# Patient Record
Sex: Male | Born: 1955 | Race: White | Hispanic: No | Marital: Married | Smoking: Former smoker
Health system: Southern US, Community
[De-identification: ages and names within clinical notes are randomized; demographics above are authoritative.]

## PROBLEM LIST (undated history)

## (undated) DIAGNOSIS — D751 Secondary polycythemia: Secondary | ICD-10-CM

## (undated) DIAGNOSIS — Z8572 Personal history of non-Hodgkin lymphomas: Principal | ICD-10-CM

## (undated) DIAGNOSIS — C801 Malignant (primary) neoplasm, unspecified: Secondary | ICD-10-CM

## (undated) DIAGNOSIS — R768 Other specified abnormal immunological findings in serum: Secondary | ICD-10-CM

## (undated) DIAGNOSIS — M545 Low back pain, unspecified: Secondary | ICD-10-CM

## (undated) DIAGNOSIS — C8218 Follicular lymphoma grade II, lymph nodes of multiple sites: Secondary | ICD-10-CM

## (undated) DIAGNOSIS — D229 Melanocytic nevi, unspecified: Secondary | ICD-10-CM

## (undated) DIAGNOSIS — M810 Age-related osteoporosis without current pathological fracture: Secondary | ICD-10-CM

## (undated) DIAGNOSIS — R109 Unspecified abdominal pain: Secondary | ICD-10-CM

## (undated) DIAGNOSIS — K458 Other specified abdominal hernia without obstruction or gangrene: Secondary | ICD-10-CM

## (undated) DIAGNOSIS — Z9221 Personal history of antineoplastic chemotherapy: Secondary | ICD-10-CM

## (undated) DIAGNOSIS — G8929 Other chronic pain: Secondary | ICD-10-CM

## (undated) DIAGNOSIS — Z23 Encounter for immunization: Secondary | ICD-10-CM

## (undated) HISTORY — PX: PORTACATH PLACEMENT: SHX2246

## (undated) HISTORY — DX: Low back pain: M54.5

## (undated) HISTORY — PX: WRIST SURGERY: SHX841

## (undated) HISTORY — DX: Personal history of non-Hodgkin lymphomas: Z85.72

## (undated) HISTORY — DX: Melanocytic nevi, unspecified: D22.9

## (undated) HISTORY — DX: Other specified abnormal immunological findings in serum: R76.8

## (undated) HISTORY — PX: DENTAL SURGERY: SHX609

## (undated) HISTORY — DX: Age-related osteoporosis without current pathological fracture: M81.0

## (undated) HISTORY — PX: LYMPH NODE BIOPSY: SHX201

## (undated) HISTORY — DX: Other specified abdominal hernia without obstruction or gangrene: K45.8

## (undated) HISTORY — DX: Follicular lymphoma grade ii, lymph nodes of multiple sites: C82.18

## (undated) HISTORY — DX: Secondary polycythemia: D75.1

## (undated) HISTORY — DX: Low back pain, unspecified: M54.50

## (undated) HISTORY — DX: Unspecified abdominal pain: R10.9

## (undated) HISTORY — DX: Other chronic pain: G89.29

## (undated) HISTORY — DX: Encounter for immunization: Z23

---

## 2009-09-01 ENCOUNTER — Emergency Department (HOSPITAL_BASED_OUTPATIENT_CLINIC_OR_DEPARTMENT_OTHER): Admission: EM | Admit: 2009-09-01 | Discharge: 2009-09-01 | Payer: Self-pay | Admitting: Emergency Medicine

## 2009-09-01 ENCOUNTER — Ambulatory Visit: Payer: Self-pay | Admitting: Diagnostic Radiology

## 2009-09-02 ENCOUNTER — Inpatient Hospital Stay (HOSPITAL_COMMUNITY): Admission: EM | Admit: 2009-09-02 | Discharge: 2009-09-04 | Payer: Self-pay | Admitting: Emergency Medicine

## 2009-09-02 ENCOUNTER — Encounter: Payer: Self-pay | Admitting: Emergency Medicine

## 2009-09-02 ENCOUNTER — Ambulatory Visit: Payer: Self-pay | Admitting: Interventional Radiology

## 2009-09-03 DIAGNOSIS — C8218 Follicular lymphoma grade II, lymph nodes of multiple sites: Secondary | ICD-10-CM

## 2009-09-03 DIAGNOSIS — R768 Other specified abnormal immunological findings in serum: Secondary | ICD-10-CM

## 2009-09-03 HISTORY — DX: Follicular lymphoma grade ii, lymph nodes of multiple sites: C82.18

## 2009-09-03 HISTORY — DX: Other specified abnormal immunological findings in serum: R76.8

## 2010-01-20 ENCOUNTER — Ambulatory Visit (HOSPITAL_COMMUNITY): Admission: RE | Admit: 2010-01-20 | Discharge: 2010-01-20 | Payer: Self-pay | Admitting: General Surgery

## 2010-02-03 ENCOUNTER — Ambulatory Visit (HOSPITAL_COMMUNITY): Admission: RE | Admit: 2010-02-03 | Discharge: 2010-02-03 | Payer: Self-pay | Admitting: General Surgery

## 2010-02-09 ENCOUNTER — Ambulatory Visit: Payer: Self-pay | Admitting: Oncology

## 2010-02-10 LAB — CBC WITH DIFFERENTIAL/PLATELET
BASO%: 0.3 % (ref 0.0–2.0)
EOS%: 2.9 % (ref 0.0–7.0)
HGB: 15.4 g/dL (ref 13.0–17.1)
MCH: 28.2 pg (ref 27.2–33.4)
MCHC: 34.7 g/dL (ref 32.0–36.0)
MCV: 81.3 fL (ref 79.3–98.0)
MONO%: 6.5 % (ref 0.0–14.0)
RBC: 5.47 10*6/uL (ref 4.20–5.82)
RDW: 13.6 % (ref 11.0–14.6)
lymph#: 1.5 10*3/uL (ref 0.9–3.3)

## 2010-02-10 LAB — COMPREHENSIVE METABOLIC PANEL
ALT: 19 U/L (ref 0–53)
AST: 15 U/L (ref 0–37)
Albumin: 4.6 g/dL (ref 3.5–5.2)
Alkaline Phosphatase: 75 U/L (ref 39–117)
Calcium: 9 mg/dL (ref 8.4–10.5)
Chloride: 103 mEq/L (ref 96–112)
Potassium: 3.9 mEq/L (ref 3.5–5.3)
Sodium: 142 mEq/L (ref 135–145)

## 2010-02-10 LAB — HEPATITIS B SURFACE ANTIBODY,QUALITATIVE: Hep B S Ab: POSITIVE — AB

## 2010-02-10 LAB — LACTATE DEHYDROGENASE: LDH: 147 U/L (ref 94–250)

## 2010-02-13 ENCOUNTER — Other Ambulatory Visit: Admission: RE | Admit: 2010-02-13 | Discharge: 2010-02-13 | Payer: Self-pay | Admitting: Oncology

## 2010-02-16 ENCOUNTER — Ambulatory Visit (HOSPITAL_COMMUNITY): Admission: RE | Admit: 2010-02-16 | Discharge: 2010-02-16 | Payer: Self-pay | Admitting: Oncology

## 2010-02-28 ENCOUNTER — Ambulatory Visit (HOSPITAL_BASED_OUTPATIENT_CLINIC_OR_DEPARTMENT_OTHER): Admission: RE | Admit: 2010-02-28 | Discharge: 2010-02-28 | Payer: Self-pay | Admitting: General Surgery

## 2010-02-28 ENCOUNTER — Encounter: Payer: Self-pay | Admitting: Oncology

## 2010-02-28 ENCOUNTER — Ambulatory Visit: Admission: RE | Admit: 2010-02-28 | Discharge: 2010-02-28 | Payer: Self-pay | Admitting: Oncology

## 2010-03-01 LAB — CBC WITH DIFFERENTIAL/PLATELET
Eosinophils Absolute: 0 10*3/uL (ref 0.0–0.5)
HCT: 42.6 % (ref 38.4–49.9)
LYMPH%: 18.9 % (ref 14.0–49.0)
MCV: 80.1 fL (ref 79.3–98.0)
MONO#: 0.5 10*3/uL (ref 0.1–0.9)
MONO%: 6.5 % (ref 0.0–14.0)
NEUT#: 6.1 10*3/uL (ref 1.5–6.5)
NEUT%: 74.4 % (ref 39.0–75.0)
Platelets: 158 10*3/uL (ref 140–400)
WBC: 8.2 10*3/uL (ref 4.0–10.3)

## 2010-03-01 LAB — COMPREHENSIVE METABOLIC PANEL
BUN: 7 mg/dL (ref 6–23)
CO2: 29 mEq/L (ref 19–32)
Creatinine, Ser: 0.72 mg/dL (ref 0.40–1.50)
Glucose, Bld: 96 mg/dL (ref 70–99)
Total Bilirubin: 0.7 mg/dL (ref 0.3–1.2)

## 2010-03-20 ENCOUNTER — Ambulatory Visit: Payer: Self-pay | Admitting: Oncology

## 2010-03-22 LAB — COMPREHENSIVE METABOLIC PANEL
ALT: 15 U/L (ref 0–53)
Albumin: 4.5 g/dL (ref 3.5–5.2)
Alkaline Phosphatase: 57 U/L (ref 39–117)
CO2: 23 mEq/L (ref 19–32)
Glucose, Bld: 130 mg/dL — ABNORMAL HIGH (ref 70–99)
Potassium: 4 mEq/L (ref 3.5–5.3)
Sodium: 140 mEq/L (ref 135–145)
Total Protein: 6.6 g/dL (ref 6.0–8.3)

## 2010-03-22 LAB — CBC WITH DIFFERENTIAL/PLATELET
BASO%: 0.9 % (ref 0.0–2.0)
EOS%: 0 % (ref 0.0–7.0)
HCT: 41.1 % (ref 38.4–49.9)
LYMPH%: 21.4 % (ref 14.0–49.0)
MCH: 28.1 pg (ref 27.2–33.4)
MCHC: 34.8 g/dL (ref 32.0–36.0)
MCV: 80.9 fL (ref 79.3–98.0)
MONO%: 8.7 % (ref 0.0–14.0)
NEUT%: 69 % (ref 39.0–75.0)
lymph#: 1.4 10*3/uL (ref 0.9–3.3)

## 2010-03-22 LAB — URIC ACID: Uric Acid, Serum: 5.6 mg/dL (ref 4.0–7.8)

## 2010-04-04 ENCOUNTER — Ambulatory Visit: Payer: Self-pay | Admitting: Dentistry

## 2010-04-04 ENCOUNTER — Encounter: Admission: AD | Admit: 2010-04-04 | Discharge: 2010-04-04 | Payer: Self-pay | Admitting: Dentistry

## 2010-04-12 LAB — COMPREHENSIVE METABOLIC PANEL
BUN: 11 mg/dL (ref 6–23)
CO2: 27 mEq/L (ref 19–32)
Creatinine, Ser: 0.65 mg/dL (ref 0.40–1.50)
Glucose, Bld: 124 mg/dL — ABNORMAL HIGH (ref 70–99)
Total Bilirubin: 0.3 mg/dL (ref 0.3–1.2)

## 2010-04-12 LAB — CBC WITH DIFFERENTIAL/PLATELET
Basophils Absolute: 0 10*3/uL (ref 0.0–0.1)
Eosinophils Absolute: 0 10*3/uL (ref 0.0–0.5)
HCT: 38.4 % (ref 38.4–49.9)
HGB: 13.3 g/dL (ref 13.0–17.1)
LYMPH%: 20.4 % (ref 14.0–49.0)
MONO#: 0.5 10*3/uL (ref 0.1–0.9)
NEUT#: 3.3 10*3/uL (ref 1.5–6.5)
Platelets: 271 10*3/uL (ref 140–400)
RBC: 4.65 10*6/uL (ref 4.20–5.82)
WBC: 4.9 10*3/uL (ref 4.0–10.3)

## 2010-04-25 ENCOUNTER — Ambulatory Visit: Payer: Self-pay | Admitting: Oncology

## 2010-04-25 LAB — CBC WITH DIFFERENTIAL/PLATELET
BASO%: 1.2 % (ref 0.0–2.0)
Eosinophils Absolute: 0 10*3/uL (ref 0.0–0.5)
HCT: 40.1 % (ref 38.4–49.9)
LYMPH%: 26.9 % (ref 14.0–49.0)
MCHC: 34.7 g/dL (ref 32.0–36.0)
MONO#: 0.8 10*3/uL (ref 0.1–0.9)
NEUT%: 57.6 % (ref 39.0–75.0)
Platelets: 191 10*3/uL (ref 140–400)
WBC: 6.1 10*3/uL (ref 4.0–10.3)

## 2010-05-03 LAB — COMPREHENSIVE METABOLIC PANEL
AST: 13 U/L (ref 0–37)
Alkaline Phosphatase: 48 U/L (ref 39–117)
BUN: 9 mg/dL (ref 6–23)
Creatinine, Ser: 0.71 mg/dL (ref 0.40–1.50)

## 2010-05-03 LAB — CBC WITH DIFFERENTIAL/PLATELET
BASO%: 1.3 % (ref 0.0–2.0)
EOS%: 0.2 % (ref 0.0–7.0)
HCT: 40 % (ref 38.4–49.9)
LYMPH%: 27.1 % (ref 14.0–49.0)
MCH: 28.7 pg (ref 27.2–33.4)
MCHC: 34.8 g/dL (ref 32.0–36.0)
MONO%: 16.4 % — ABNORMAL HIGH (ref 0.0–14.0)
NEUT%: 55 % (ref 39.0–75.0)
Platelets: 195 10*3/uL (ref 140–400)

## 2010-05-24 LAB — CBC WITH DIFFERENTIAL/PLATELET
EOS%: 0 % (ref 0.0–7.0)
Eosinophils Absolute: 0 10*3/uL (ref 0.0–0.5)
MCH: 29.4 pg (ref 27.2–33.4)
MCV: 84.5 fL (ref 79.3–98.0)
MONO%: 12.5 % (ref 0.0–14.0)
NEUT#: 3.9 10*3/uL (ref 1.5–6.5)
RBC: 4.52 10*6/uL (ref 4.20–5.82)
RDW: 15.1 % — ABNORMAL HIGH (ref 11.0–14.6)
lymph#: 1 10*3/uL (ref 0.9–3.3)

## 2010-05-24 LAB — COMPREHENSIVE METABOLIC PANEL
AST: 15 U/L (ref 0–37)
Albumin: 4.3 g/dL (ref 3.5–5.2)
Alkaline Phosphatase: 50 U/L (ref 39–117)
Calcium: 9.4 mg/dL (ref 8.4–10.5)
Chloride: 105 mEq/L (ref 96–112)
Potassium: 4.1 mEq/L (ref 3.5–5.3)
Sodium: 142 mEq/L (ref 135–145)
Total Protein: 6.3 g/dL (ref 6.0–8.3)

## 2010-06-12 ENCOUNTER — Ambulatory Visit: Payer: Self-pay | Admitting: Oncology

## 2010-06-14 LAB — CBC WITH DIFFERENTIAL/PLATELET
BASO%: 0.4 % (ref 0.0–2.0)
EOS%: 0.2 % (ref 0.0–7.0)
HCT: 39 % (ref 38.4–49.9)
MCH: 29.6 pg (ref 27.2–33.4)
MCHC: 34.6 g/dL (ref 32.0–36.0)
MONO#: 0.5 10*3/uL (ref 0.1–0.9)
NEUT%: 70.2 % (ref 39.0–75.0)
RBC: 4.56 10*6/uL (ref 4.20–5.82)
WBC: 5.3 10*3/uL (ref 4.0–10.3)
lymph#: 1.1 10*3/uL (ref 0.9–3.3)
nRBC: 0 % (ref 0–0)

## 2010-06-14 LAB — LACTATE DEHYDROGENASE: LDH: 127 U/L (ref 94–250)

## 2010-06-14 LAB — COMPREHENSIVE METABOLIC PANEL
AST: 14 U/L (ref 0–37)
Albumin: 4.6 g/dL (ref 3.5–5.2)
BUN: 10 mg/dL (ref 6–23)
CO2: 26 mEq/L (ref 19–32)
Calcium: 9.6 mg/dL (ref 8.4–10.5)
Chloride: 104 mEq/L (ref 96–112)
Creatinine, Ser: 0.7 mg/dL (ref 0.40–1.50)
Glucose, Bld: 118 mg/dL — ABNORMAL HIGH (ref 70–99)
Potassium: 4.2 mEq/L (ref 3.5–5.3)

## 2010-06-14 LAB — URIC ACID: Uric Acid, Serum: 5.2 mg/dL (ref 4.0–7.8)

## 2010-06-14 LAB — CHCC SMEAR

## 2010-07-06 ENCOUNTER — Ambulatory Visit (HOSPITAL_COMMUNITY): Admission: RE | Admit: 2010-07-06 | Discharge: 2010-07-06 | Payer: Self-pay | Admitting: Oncology

## 2010-07-10 ENCOUNTER — Ambulatory Visit (HOSPITAL_COMMUNITY): Admission: RE | Admit: 2010-07-10 | Discharge: 2010-07-10 | Payer: Self-pay | Admitting: Oncology

## 2010-07-12 ENCOUNTER — Ambulatory Visit: Payer: Self-pay | Admitting: Oncology

## 2010-07-12 LAB — COMPREHENSIVE METABOLIC PANEL
ALT: 13 U/L (ref 0–53)
BUN: 9 mg/dL (ref 6–23)
CO2: 27 mEq/L (ref 19–32)
Calcium: 9.7 mg/dL (ref 8.4–10.5)
Chloride: 100 mEq/L (ref 96–112)
Creatinine, Ser: 0.78 mg/dL (ref 0.40–1.50)
Glucose, Bld: 133 mg/dL — ABNORMAL HIGH (ref 70–99)
Total Bilirubin: 0.5 mg/dL (ref 0.3–1.2)

## 2010-07-12 LAB — CBC WITH DIFFERENTIAL/PLATELET
Eosinophils Absolute: 0 10*3/uL (ref 0.0–0.5)
MONO#: 0.4 10*3/uL (ref 0.1–0.9)
NEUT#: 3.5 10*3/uL (ref 1.5–6.5)
Platelets: 279 10*3/uL (ref 140–400)
RBC: 4.43 10*6/uL (ref 4.20–5.82)
RDW: 15.8 % — ABNORMAL HIGH (ref 11.0–14.6)
WBC: 4.9 10*3/uL (ref 4.0–10.3)

## 2010-07-25 ENCOUNTER — Encounter: Payer: Self-pay | Admitting: Oncology

## 2010-07-25 ENCOUNTER — Ambulatory Visit
Admission: RE | Admit: 2010-07-25 | Discharge: 2010-07-25 | Payer: Self-pay | Source: Home / Self Care | Admitting: Oncology

## 2010-08-14 ENCOUNTER — Ambulatory Visit: Payer: Self-pay | Admitting: Oncology

## 2010-08-16 LAB — COMPREHENSIVE METABOLIC PANEL
Albumin: 4.1 g/dL (ref 3.5–5.2)
CO2: 28 mEq/L (ref 19–32)
Glucose, Bld: 110 mg/dL — ABNORMAL HIGH (ref 70–99)
Sodium: 141 mEq/L (ref 135–145)
Total Bilirubin: 0.5 mg/dL (ref 0.3–1.2)
Total Protein: 6.5 g/dL (ref 6.0–8.3)

## 2010-08-16 LAB — CBC WITH DIFFERENTIAL/PLATELET
Basophils Absolute: 0 10*3/uL (ref 0.0–0.1)
Eosinophils Absolute: 0.1 10*3/uL (ref 0.0–0.5)
HCT: 40.1 % (ref 38.4–49.9)
HGB: 13.9 g/dL (ref 13.0–17.1)
LYMPH%: 21.9 % (ref 14.0–49.0)
MONO#: 0.4 10*3/uL (ref 0.1–0.9)
NEUT#: 2.8 10*3/uL (ref 1.5–6.5)
Platelets: 258 10*3/uL (ref 140–400)
RBC: 4.66 10*6/uL (ref 4.20–5.82)
WBC: 4.2 10*3/uL (ref 4.0–10.3)

## 2010-11-03 ENCOUNTER — Other Ambulatory Visit: Payer: Self-pay | Admitting: Oncology

## 2010-11-03 ENCOUNTER — Encounter (HOSPITAL_BASED_OUTPATIENT_CLINIC_OR_DEPARTMENT_OTHER): Payer: Medicare Other | Admitting: Oncology

## 2010-11-03 DIAGNOSIS — B181 Chronic viral hepatitis B without delta-agent: Secondary | ICD-10-CM

## 2010-11-03 DIAGNOSIS — Z5112 Encounter for antineoplastic immunotherapy: Secondary | ICD-10-CM

## 2010-11-03 DIAGNOSIS — R1012 Left upper quadrant pain: Secondary | ICD-10-CM

## 2010-11-03 DIAGNOSIS — C8590 Non-Hodgkin lymphoma, unspecified, unspecified site: Secondary | ICD-10-CM

## 2010-11-03 DIAGNOSIS — C8299 Follicular lymphoma, unspecified, extranodal and solid organ sites: Secondary | ICD-10-CM

## 2010-11-03 DIAGNOSIS — R112 Nausea with vomiting, unspecified: Secondary | ICD-10-CM

## 2010-11-03 LAB — CBC WITH DIFFERENTIAL/PLATELET
BASO%: 0.3 % (ref 0.0–2.0)
Eosinophils Absolute: 0.1 10*3/uL (ref 0.0–0.5)
HCT: 48.3 % (ref 38.4–49.9)
MCHC: 34.4 g/dL (ref 32.0–36.0)
MONO#: 0.4 10*3/uL (ref 0.1–0.9)
NEUT#: 4.9 10*3/uL (ref 1.5–6.5)
NEUT%: 75.3 % — ABNORMAL HIGH (ref 39.0–75.0)
Platelets: 216 10*3/uL (ref 140–400)
WBC: 6.6 10*3/uL (ref 4.0–10.3)
lymph#: 1.2 10*3/uL (ref 0.9–3.3)
nRBC: 0 % (ref 0–0)

## 2010-11-03 LAB — COMPREHENSIVE METABOLIC PANEL
ALT: 16 U/L (ref 0–53)
AST: 12 U/L (ref 0–37)
Albumin: 5.1 g/dL (ref 3.5–5.2)
BUN: 13 mg/dL (ref 6–23)
CO2: 28 mEq/L (ref 19–32)
Calcium: 9.5 mg/dL (ref 8.4–10.5)
Chloride: 102 mEq/L (ref 96–112)
Potassium: 4 mEq/L (ref 3.5–5.3)

## 2010-11-03 LAB — LACTATE DEHYDROGENASE: LDH: 133 U/L (ref 94–250)

## 2010-11-14 LAB — CBC
MCH: 29.9 pg (ref 26.0–34.0)
MCHC: 34.3 g/dL (ref 30.0–36.0)
Platelets: 266 10*3/uL (ref 150–400)
RDW: 16 % — ABNORMAL HIGH (ref 11.5–15.5)

## 2010-11-14 LAB — PROTIME-INR: Prothrombin Time: 12.2 seconds (ref 11.6–15.2)

## 2010-11-14 LAB — CHROMOSOME ANALYSIS, BONE MARROW

## 2010-11-19 LAB — GLUCOSE, CAPILLARY: Glucose-Capillary: 102 mg/dL — ABNORMAL HIGH (ref 70–99)

## 2010-11-20 LAB — CBC
HCT: 46.8 % (ref 39.0–52.0)
Hemoglobin: 15.8 g/dL (ref 13.0–17.0)
RBC: 5.66 MIL/uL (ref 4.22–5.81)
RDW: 13.2 % (ref 11.5–15.5)
WBC: 6 10*3/uL (ref 4.0–10.5)

## 2010-11-20 LAB — URINALYSIS, ROUTINE W REFLEX MICROSCOPIC
Glucose, UA: NEGATIVE mg/dL
Ketones, ur: NEGATIVE mg/dL
Nitrite: NEGATIVE
Specific Gravity, Urine: 1.022 (ref 1.005–1.030)
pH: 6 (ref 5.0–8.0)

## 2010-11-20 LAB — DIFFERENTIAL
Basophils Absolute: 0 10*3/uL (ref 0.0–0.1)
Basophils Relative: 0 % (ref 0–1)
Eosinophils Absolute: 0.1 10*3/uL (ref 0.0–0.7)
Monocytes Relative: 5 % (ref 3–12)
Neutro Abs: 3.7 10*3/uL (ref 1.7–7.7)
Neutrophils Relative %: 63 % (ref 43–77)

## 2010-11-20 LAB — COMPREHENSIVE METABOLIC PANEL
ALT: 24 U/L (ref 0–53)
Alkaline Phosphatase: 74 U/L (ref 39–117)
BUN: 8 mg/dL (ref 6–23)
CO2: 32 mEq/L (ref 19–32)
Chloride: 105 mEq/L (ref 96–112)
Glucose, Bld: 115 mg/dL — ABNORMAL HIGH (ref 70–99)
Potassium: 4.6 mEq/L (ref 3.5–5.1)
Sodium: 144 mEq/L (ref 135–145)
Total Bilirubin: 0.5 mg/dL (ref 0.3–1.2)
Total Protein: 7.1 g/dL (ref 6.0–8.3)

## 2010-11-20 LAB — PROTIME-INR: INR: 0.97 (ref 0.00–1.49)

## 2010-12-04 LAB — BASIC METABOLIC PANEL
CO2: 33 mEq/L — ABNORMAL HIGH (ref 19–32)
Chloride: 101 mEq/L (ref 96–112)
Creatinine, Ser: 0.9 mg/dL (ref 0.4–1.5)
GFR calc Af Amer: >60 mL/min (ref >60)
Glucose, Bld: 109 mg/dL — ABNORMAL HIGH (ref 70–99)
Sodium: 143 mEq/L (ref 135–145)

## 2010-12-04 LAB — DIFFERENTIAL
Basophils Relative: 1 % (ref 0–1)
Eosinophils Absolute: 0 10*3/uL (ref 0.0–0.7)
Monocytes Absolute: 0.4 10*3/uL (ref 0.1–1.0)
Monocytes Relative: 7 % (ref 3–12)

## 2010-12-04 LAB — CBC
Hemoglobin: 14.7 g/dL (ref 13.0–17.0)
MCHC: 34.6 g/dL (ref 30.0–36.0)
MCV: 82.2 fL (ref 78.0–100.0)
RBC: 5.18 MIL/uL (ref 4.22–5.81)
RDW: 12.5 % (ref 11.5–15.5)

## 2010-12-26 IMAGING — CT CT CHEST W/ CM
2 of 4 series · 16 of 46 positions shown, 18 images · IV contrast (agent unspecified)
Comparison: None.

CT CHEST

CLINICAL DATA: Non-Hodgkins lymphoma.  Pre-chemotherapy.

CT CHEST, ABDOMEN AND PELVIS WITH CONTRAST
TECHNIQUE: Multidetector CT imaging of the chest, abdomen and
pelvis was performed following the standard protocol during bolus
administration of intravenous contrast.
Contrast: 100 ml [id number].

[Series 2: cap with st · axial · 0.73mm/px · z∈[-664,-78]mm · 13 of 129 slices shown, 15 images]
[im 6/129  soft-tissue]
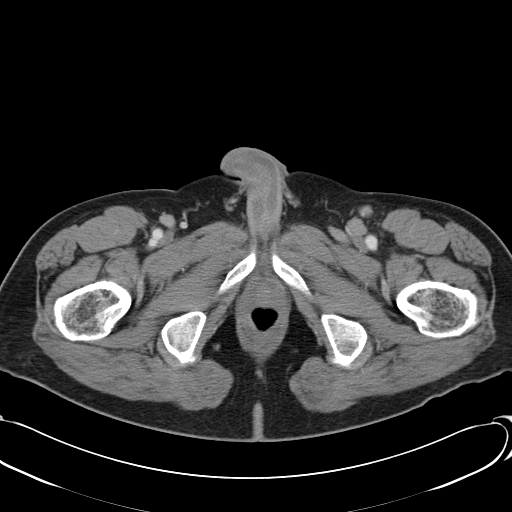
[im 6/129  bone]
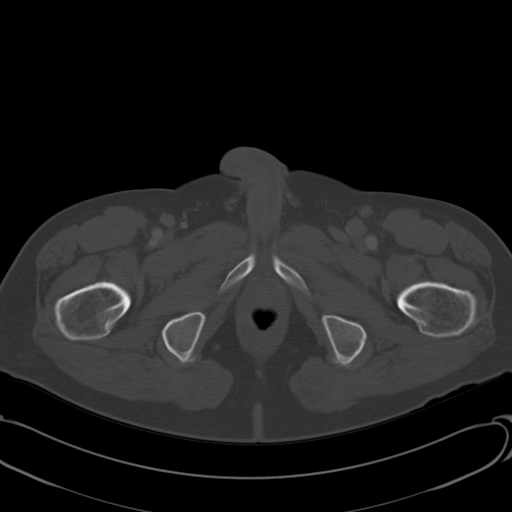
[im 16/129  soft-tissue]
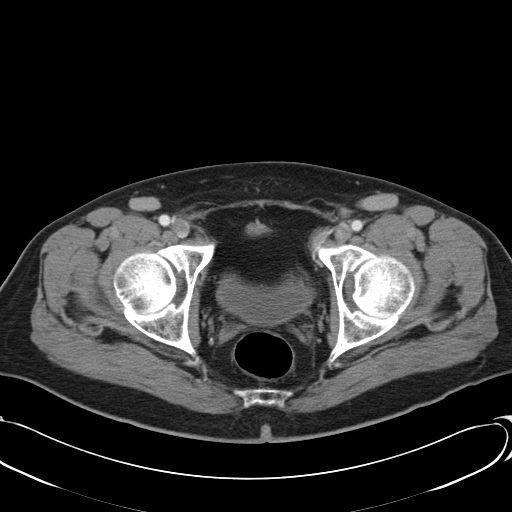
[im 26/129  soft-tissue]
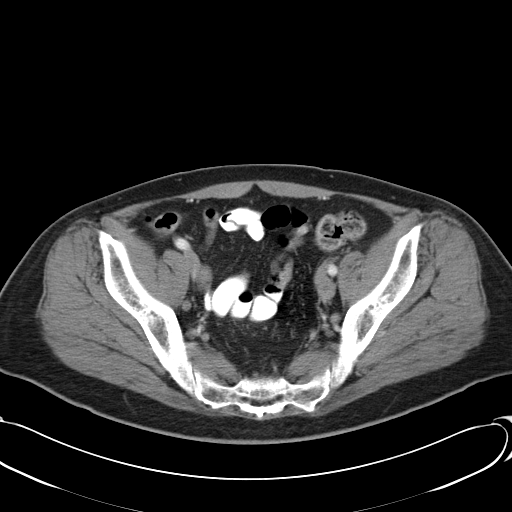
[im 36/129  soft-tissue]
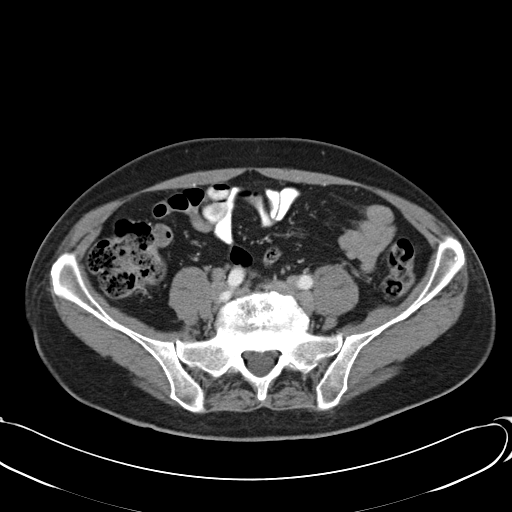
[im 47/129  soft-tissue]
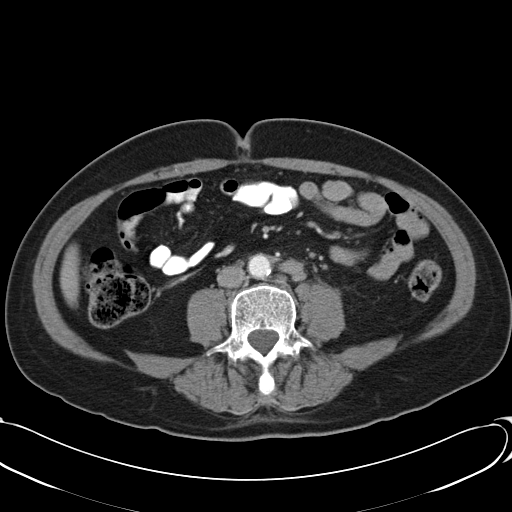
[im 57/129  soft-tissue]
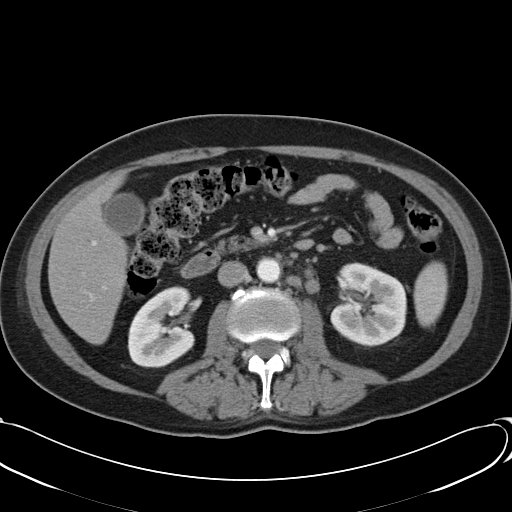
[im 67/129  soft-tissue]
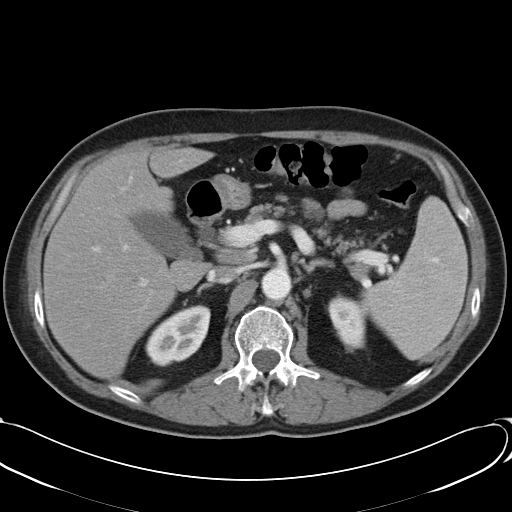
[im 72/129  soft-tissue]
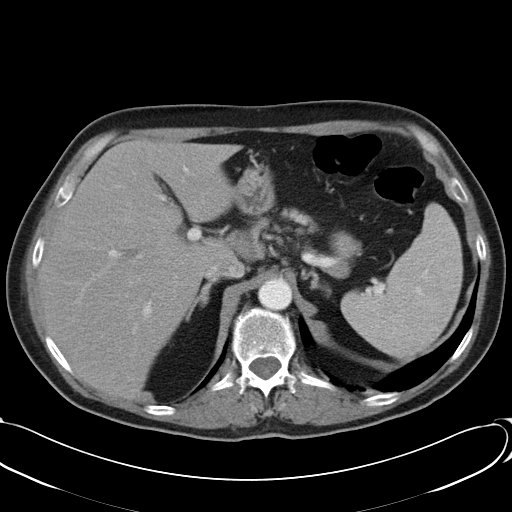
[im 82/129  soft-tissue]
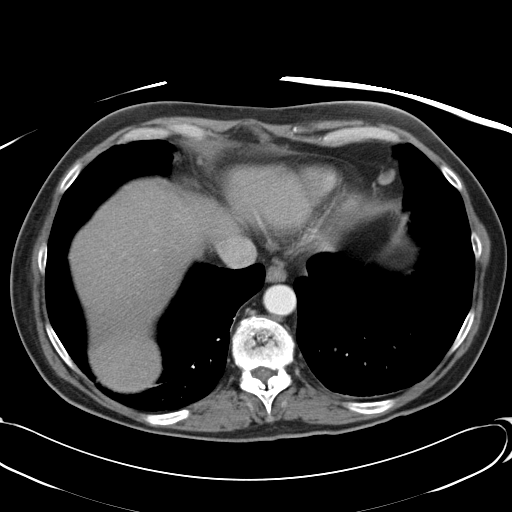
[im 82/129  bone]
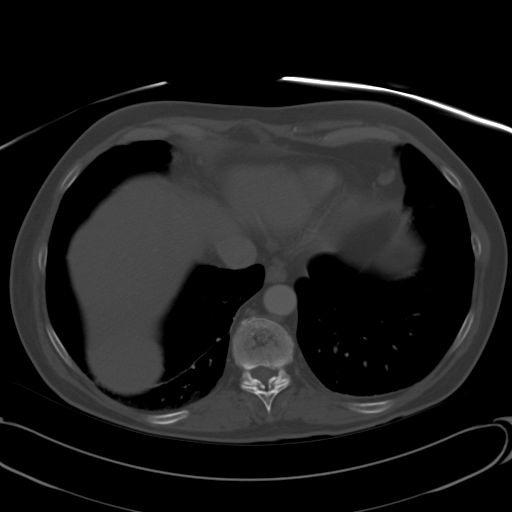
[im 93/129  soft-tissue]
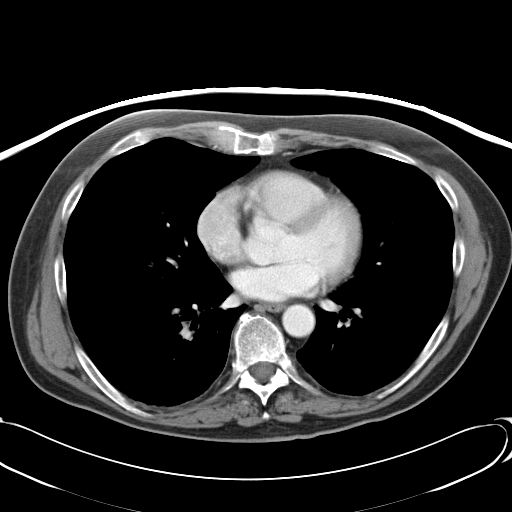
[im 103/129  soft-tissue]
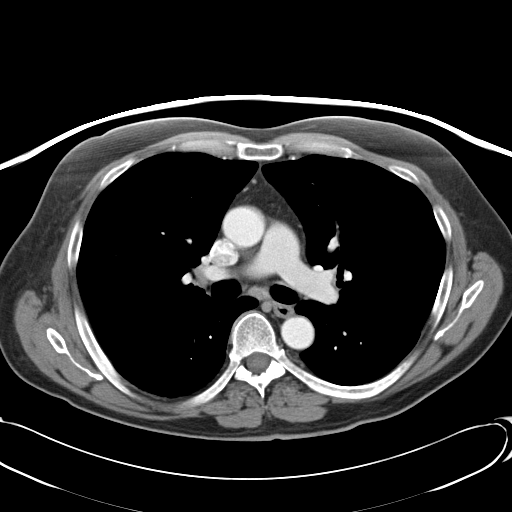
[im 113/129  soft-tissue]
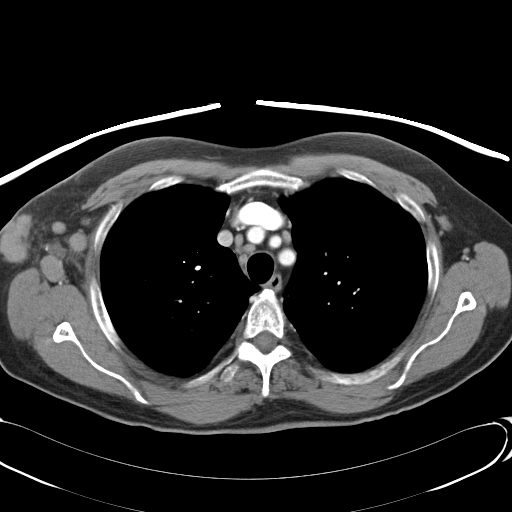
[im 123/129  soft-tissue]
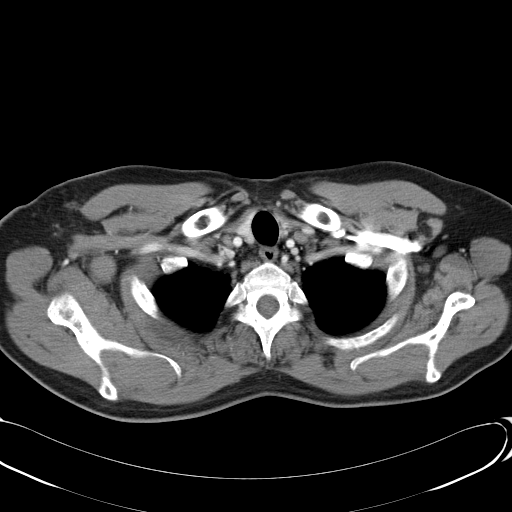

[Series 602: <mpr thick range> · coronal · 1.26mm/px · 3 of 104 slices shown]
[im 35/104  soft-tissue]
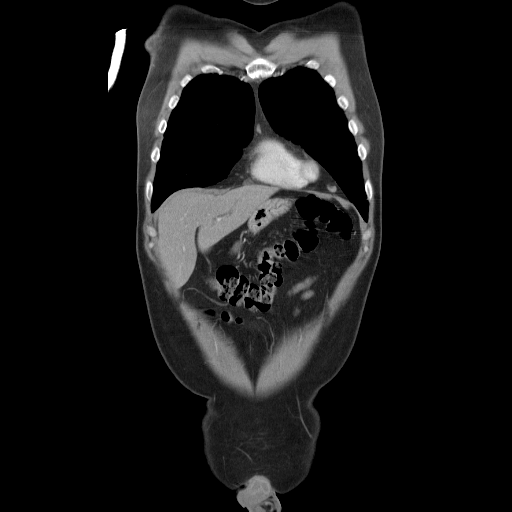
[im 46/104  soft-tissue]
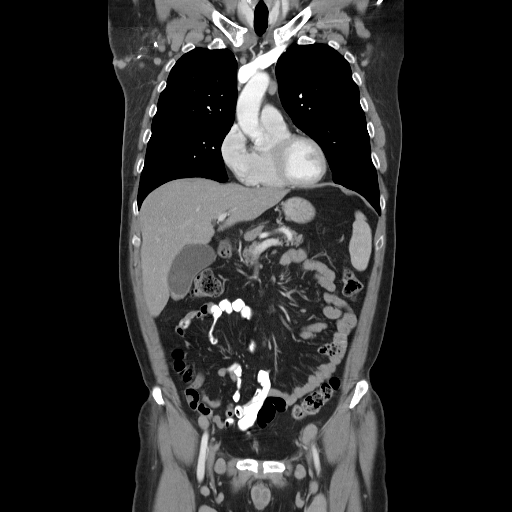
[im 58/104  soft-tissue]
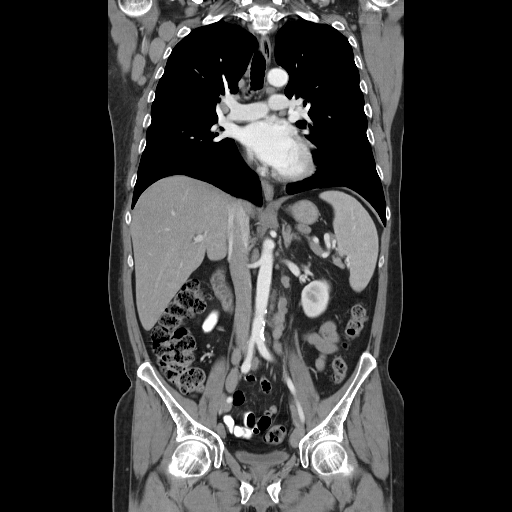

[16 of 46 positions shown; findings below may reference images not displayed]

FINDINGS: T11 mild anterior wedge compression deformity involving
superior endplate with slight buckling anterior margin and minimal
retrolisthesis posterior-superior margin with mild spinal stenosis.
This appears remote.

Mild sclerosis T5.  Attention to this on follow-up.

Scattered mild pulmonary parenchymal changes suggestive of
scarring/atelectasis.  Right middle lobe 3 mm nonspecific lesion
(series 5 image 41) attention to this on follow-up.

Bulky bilateral axillary and sub pectoralis adenopathy more notable
on the right where the patient has had recent surgery.  Index
rounded right axillary lymph node with transverse dimension of
x 3.5 cm.  Index left axillary lymph node with 2.3 x 1.2 cm
transverse measurement.  Slightly prominent epicardial lymph nodes.

Mediastinal adenopathy, hilar adenopathy and supraclavicular
adenopathy.  Index aortic pulmonary/prevascular lymph node measures
3 x 1.4 cm.

Minimal ectasia ascending thoracic aorta measuring up to 3.1 cm.
IMPRESSION: Axillary, sub pectoralis, supraclavicular, mediastinal and hilar
adenopathy as described above.

Scattered mild pulmonary parenchymal changes may represent areas of
scarring/atelectasis.  Stability can be confirmed on follow-up.

T11 mild anterior wedge compression deformity involving superior
endplate with slight buckling anterior margin and minimal
retrolisthesis posterior-superior margin with mild spinal stenosis.
This appears remote.

Mild sclerosis T5.  Attention to this on follow-up.

CT ABDOMEN AND PELVIS
FINDINGS: Abdominal and pelvic adenopathy.  This involves
gastrohepatic, peripancreatic, portacaval, inter aortocaval,
periaortic, common iliac, external iliac, internal iliac, obturator
and inguinal lymph nodes.  Index lymph nodes are; left inguinal
lymph node with maximal transverse dimension of 3.4 x 2.6 cm, left
external iliac 4.5 x 1.9 cm lymph node, periaortic 2 x 1.2 cm lymph
node and portacaval 3.4 x 1.5 cm lymph node.

The spleen spans over 13.8 cm.

Mild fatty infiltration of the liver without focal liver mass.  No
adrenal, renal pancreatic or focal splenic lesion.  No calcified
gallstones.  Mild thickening of the gastric fundus.

Atherosclerotic type changes of the aorta and common iliac
arteries.  No abdominal aortic aneurysm.

Schmorl's node deformities superior endplate L2.  Degenerative
changes L5-S1.
IMPRESSION: Abdominal and pelvic adenopathy as described above.

The spleen spans over 13.8 cm.

## 2011-01-04 ENCOUNTER — Ambulatory Visit (HOSPITAL_COMMUNITY)
Admission: RE | Admit: 2011-01-04 | Discharge: 2011-01-04 | Disposition: A | Discharge status: Home / Self Care | Payer: Medicare Other | Source: Ambulatory Visit | Attending: Oncology | Admitting: Oncology

## 2011-01-04 ENCOUNTER — Encounter (HOSPITAL_COMMUNITY): Payer: Self-pay

## 2011-01-04 DIAGNOSIS — C8589 Other specified types of non-Hodgkin lymphoma, extranodal and solid organ sites: Secondary | ICD-10-CM | POA: Insufficient documentation

## 2011-01-04 DIAGNOSIS — C8590 Non-Hodgkin lymphoma, unspecified, unspecified site: Secondary | ICD-10-CM

## 2011-01-04 DIAGNOSIS — Z9089 Acquired absence of other organs: Secondary | ICD-10-CM | POA: Insufficient documentation

## 2011-01-04 DIAGNOSIS — K838 Other specified diseases of biliary tract: Secondary | ICD-10-CM | POA: Insufficient documentation

## 2011-01-04 MED ORDER — IOHEXOL 300 MG/ML  SOLN
100.0000 mL | Freq: Once | INTRAMUSCULAR | Status: AC | PRN
  Administered 2011-01-04: 100 mL via INTRAVENOUS

## 2011-01-05 ENCOUNTER — Other Ambulatory Visit: Payer: Self-pay | Admitting: Oncology

## 2011-01-05 ENCOUNTER — Encounter (HOSPITAL_BASED_OUTPATIENT_CLINIC_OR_DEPARTMENT_OTHER): Payer: Medicare Other | Admitting: Oncology

## 2011-01-05 DIAGNOSIS — R1012 Left upper quadrant pain: Secondary | ICD-10-CM

## 2011-01-05 DIAGNOSIS — C8299 Follicular lymphoma, unspecified, extranodal and solid organ sites: Secondary | ICD-10-CM

## 2011-01-05 DIAGNOSIS — C8590 Non-Hodgkin lymphoma, unspecified, unspecified site: Secondary | ICD-10-CM

## 2011-01-05 DIAGNOSIS — Z5112 Encounter for antineoplastic immunotherapy: Secondary | ICD-10-CM

## 2011-01-05 DIAGNOSIS — R112 Nausea with vomiting, unspecified: Secondary | ICD-10-CM

## 2011-01-05 LAB — COMPREHENSIVE METABOLIC PANEL
AST: 15 U/L (ref 0–37)
Alkaline Phosphatase: 65 U/L (ref 39–117)
Glucose, Bld: 103 mg/dL — ABNORMAL HIGH (ref 70–99)
Sodium: 141 mEq/L (ref 135–145)
Total Bilirubin: 0.3 mg/dL (ref 0.3–1.2)
Total Protein: 6.3 g/dL (ref 6.0–8.3)

## 2011-01-05 LAB — CBC WITH DIFFERENTIAL/PLATELET
BASO%: 0.4 % (ref 0.0–2.0)
EOS%: 0.7 % (ref 0.0–7.0)
MCH: 29.2 pg (ref 27.2–33.4)
MCHC: 34.8 g/dL (ref 32.0–36.0)
MCV: 84.1 fL (ref 79.3–98.0)
MONO%: 6.2 % (ref 0.0–14.0)
RBC: 4.96 10*6/uL (ref 4.20–5.82)
RDW: 13.6 % (ref 11.0–14.6)
nRBC: 0 % (ref 0–0)

## 2011-03-09 ENCOUNTER — Encounter (HOSPITAL_BASED_OUTPATIENT_CLINIC_OR_DEPARTMENT_OTHER): Payer: Medicare Other | Admitting: Oncology

## 2011-03-09 ENCOUNTER — Other Ambulatory Visit: Payer: Self-pay | Admitting: Oncology

## 2011-03-09 DIAGNOSIS — Z5111 Encounter for antineoplastic chemotherapy: Secondary | ICD-10-CM

## 2011-03-09 DIAGNOSIS — R112 Nausea with vomiting, unspecified: Secondary | ICD-10-CM

## 2011-03-09 DIAGNOSIS — B181 Chronic viral hepatitis B without delta-agent: Secondary | ICD-10-CM

## 2011-03-09 DIAGNOSIS — C8299 Follicular lymphoma, unspecified, extranodal and solid organ sites: Secondary | ICD-10-CM

## 2011-03-09 DIAGNOSIS — R1012 Left upper quadrant pain: Secondary | ICD-10-CM

## 2011-03-09 DIAGNOSIS — Z5112 Encounter for antineoplastic immunotherapy: Secondary | ICD-10-CM

## 2011-03-09 LAB — COMPREHENSIVE METABOLIC PANEL
AST: 13 U/L (ref 0–37)
Albumin: 4.4 g/dL (ref 3.5–5.2)
Alkaline Phosphatase: 60 U/L (ref 39–117)
BUN: 9 mg/dL (ref 6–23)
Calcium: 9.2 mg/dL (ref 8.4–10.5)
Chloride: 104 mEq/L (ref 96–112)
Glucose, Bld: 93 mg/dL (ref 70–99)
Potassium: 4 mEq/L (ref 3.5–5.3)
Sodium: 140 mEq/L (ref 135–145)
Total Protein: 6.1 g/dL (ref 6.0–8.3)

## 2011-03-09 LAB — CBC WITH DIFFERENTIAL/PLATELET
Basophils Absolute: 0 10*3/uL (ref 0.0–0.1)
EOS%: 4.1 % (ref 0.0–7.0)
HCT: 42.9 % (ref 38.4–49.9)
HGB: 14.8 g/dL (ref 13.0–17.1)
MONO#: 0.5 10*3/uL (ref 0.1–0.9)
NEUT#: 4.4 10*3/uL (ref 1.5–6.5)
NEUT%: 71.2 % (ref 39.0–75.0)
RDW: 13.3 % (ref 11.0–14.6)
WBC: 6.1 10*3/uL (ref 4.0–10.3)
lymph#: 1 10*3/uL (ref 0.9–3.3)

## 2011-03-09 LAB — LACTATE DEHYDROGENASE: LDH: 114 U/L (ref 94–250)

## 2011-03-27 ENCOUNTER — Other Ambulatory Visit (HOSPITAL_COMMUNITY): Payer: Medicare Other | Admitting: Dentistry

## 2011-03-27 DIAGNOSIS — C8589 Other specified types of non-Hodgkin lymphoma, extranodal and solid organ sites: Secondary | ICD-10-CM

## 2011-03-28 ENCOUNTER — Encounter (HOSPITAL_COMMUNITY): Payer: Medicare Other

## 2011-03-28 ENCOUNTER — Other Ambulatory Visit (HOSPITAL_COMMUNITY): Payer: Self-pay | Admitting: Dentistry

## 2011-03-28 LAB — CBC
Platelets: 193 10*3/uL (ref 150–400)
RBC: 5.07 MIL/uL (ref 4.22–5.81)
WBC: 5.9 10*3/uL (ref 4.0–10.5)

## 2011-03-28 LAB — COMPREHENSIVE METABOLIC PANEL
ALT: 17 U/L (ref 0–53)
AST: 15 U/L (ref 0–37)
Alkaline Phosphatase: 72 U/L (ref 39–117)
CO2: 31 mEq/L (ref 19–32)
Calcium: 9.8 mg/dL (ref 8.4–10.5)
Chloride: 100 mEq/L (ref 96–112)
GFR calc non Af Amer: >60 mL/min (ref >60)
Potassium: 4.2 mEq/L (ref 3.5–5.1)
Sodium: 138 mEq/L (ref 135–145)
Total Bilirubin: 0.4 mg/dL (ref 0.3–1.2)

## 2011-03-30 ENCOUNTER — Ambulatory Visit (HOSPITAL_COMMUNITY)
Admission: RE | Admit: 2011-03-30 | Discharge: 2011-03-30 | Disposition: A | Discharge status: Home / Self Care | Payer: Medicare Other | Source: Ambulatory Visit | Attending: Dentistry | Admitting: Dentistry

## 2011-03-30 DIAGNOSIS — K0530 Chronic periodontitis, unspecified: Secondary | ICD-10-CM

## 2011-03-30 DIAGNOSIS — K029 Dental caries, unspecified: Secondary | ICD-10-CM | POA: Insufficient documentation

## 2011-03-30 DIAGNOSIS — K083 Retained dental root: Secondary | ICD-10-CM | POA: Insufficient documentation

## 2011-03-30 DIAGNOSIS — Z01812 Encounter for preprocedural laboratory examination: Secondary | ICD-10-CM | POA: Insufficient documentation

## 2011-03-30 DIAGNOSIS — C8589 Other specified types of non-Hodgkin lymphoma, extranodal and solid organ sites: Secondary | ICD-10-CM | POA: Insufficient documentation

## 2011-03-30 DIAGNOSIS — Z79899 Other long term (current) drug therapy: Secondary | ICD-10-CM | POA: Insufficient documentation

## 2011-03-30 DIAGNOSIS — M810 Age-related osteoporosis without current pathological fracture: Secondary | ICD-10-CM | POA: Insufficient documentation

## 2011-03-30 DIAGNOSIS — K08409 Partial loss of teeth, unspecified cause, unspecified class: Secondary | ICD-10-CM | POA: Insufficient documentation

## 2011-03-30 DIAGNOSIS — K08109 Complete loss of teeth, unspecified cause, unspecified class: Secondary | ICD-10-CM | POA: Insufficient documentation

## 2011-04-04 NOTE — Op Note (Signed)
NAME:  Edwin Yu, Edwin Yu NO.:  [id number]  MEDICAL RECORD NO.:  [id number]  LOCATION:  [facility]                         FACILITY:  [facility]  PHYSICIAN:  Cindra Eves, D.D.S.DATE OF BIRTH:  08-10-1956  DATE OF PROCEDURE:  03/30/2011 DATE OF DISCHARGE:                              OPERATIVE REPORT   PREOPERATIVE DIAGNOSES: 1. Non-Hodgkin's B-cell lymphoma. 2. Osteoporosis with a history of use of oral bisphosphonates. 3. Chronic periodontitis. 4. Multiple retained root segments. 5. Rampant dental caries.  POSTOPERATIVE DIAGNOSES: 1. Non-Hodgkin's B-cell lymphoma. 2. Osteoporosis with a history of use of oral bisphosphonates. 3. Chronic periodontitis. 4. Multiple retained root segments. 5. Rampant dental caries  OPERATIONS: 1. Extraction of multiple teeth (tooth numbers 2, 3, 4, 5, 6, 7, 8, 9,     10, 11, 12, 13, 15, 23, 24, 25, 26, 27 and 29). 2. Four quadrants of alveoloplasty.  SURGEON:  Cindra Eves, D.D.S.  ANESTHESIA:  General anesthesia via nasoendotracheal tube.  Dr. Leta Jungling- attending.  MEDICATIONS: 1. Ancef 1 gram IV prior to invasive dental procedures. 2. Local anesthesia with a total utilization of 4 carpules each     containing 34 mg of lidocaine with 0.017 mg of epinephrine as well     as 2 carpules each containing 9 mg of bupivacaine with 0.009 mg of     epinephrine.  SPECIMENS:  There were 19 teeth that were discarded.  DRAINS:  None.  CULTURES:  None.  COMPLICATIONS:  None.  ESTIMATED BLOOD LOSS:  100 mL.  IV FLUIDS:  800 mL of lactated Ringer solution.  INDICATIONS:  The patient with a history of non-Hodgkin's lymphoma.  A dental consultation was requested to rule out dental infection that may affect the patient's systemic health.  The patient was examined and treatment planned for extraction of multiple teeth with alveoloplasty and preprosthetic surgery as indicated with the exception of the impacted tooth #17 and #32.  This  treatment plan was formulated to decrease the risk and complications associated with dental infection from further affecting the patient's systemic health.  The patient also with a known history of osteoporosis and previous oral bisphosphonate. The patient had previously undergone dental extractions without any complication or osteonecrosis of the jaw, although risks were again discussed with the patient and the patient did agree to proceed with the procedures as planned.  OPERATIVE FINDINGS:  The patient was examined in operating room #3.  The teeth were identified for extraction.  The patient noted be affected by chronic periodontitis, rampant dental caries, and multiple missing teeth.  DESCRIPTION OF PROCEDURE:  The patient was brought to the main operating room #3.  The patient then placed in supine position on the operating room table.  General anesthesia was then induced via a nasoendotracheal tube.  The patient was then prepped and draped in usual manner for dental medicine procedure.  A time-out was then performed.  The patient was identified and procedures were verified.  A throat pack was placed at this time.  The oral cavity was then thoroughly examined with findings noted above.  The patient was then ready for the dental medicine procedure as follows:.  Local anesthesia was administered sequentially with a total utilization  of 4 carpules each containing 34 mg of lidocaine with 0.017 mg of epinephrine as well as 2 carpules each containing 9 mg of bupivacaine with 0.009 mg of epinephrine.  The maxillary right and left quadrants were first approached. Anesthesia was delivered via infiltration utilizing the lidocaine with epinephrine.  At this point in time, a 15 blade incision was made from the maxillary right tuberosity and extended to the maxillary left tuberosity.  A surgical flap was then carefully reflected.  Appropriate amounts of buccal and interseptal bone were  removed with a surgical handpiece and bur and copious amounts of sterile saline.  The teeth were then subluxated with a series of straight elevators.  Tooth numbers 2 and 3 were then removed with the 53R forceps without complications.  Tooth numbers 4. 5, 6, 7, 8,  9, 10, 11, 12, and 13 were then removed with a 150 forceps without complications.  Tooth #15 was then removed with a 53L forceps without complications.  Alveoplasty was then performed utilizing rongeurs and bone file.  The tissues were approximated and trimmed appropriately.  The surgical site was then irrigated with copious amounts of sterile saline.  The maxillary right surgical site was then closed from the maxillary right tuberosity extended to the mesial of #8 utilizing 3-0 chromic gut suture and a continuous interrupted suture technique x1.  The maxillary left surgical site was then closed from the maxillary left tuberosity and extended to the mesial #9 utilizing 3-0 chromic gut suture in a continuous interrupted suture technique x1.  At this point in time, the mandibular quadrants were approached.  The patient was given bilateral inferior alveolar nerve blocks and long buccal nerve blocks utilizing the bupivacaine with epinephrine.  Further infiltration was then achieved utilizing the lidocaine with epinephrine as indicated.  At this point in time, a 15 blade incision was made from the distal of #21 and extended to the distal of #30.  A surgical flap was then carefully reflected.  Appropriate amounts of buccal and interseptal bone was removed around tooth numbers 27 and 29 appropriately.  All teeth were then subluxated with a series of straight elevators.  Tooth numbers 23, 24, 25, 26, 27 and 29 were then removed with 151 forceps without complications.  Alveoplasty was then performed utilizing rongeurs and bone file.  The tissues were approximated and trimmed appropriately.  The surgical site was then irrigated  with copious amounts of sterile saline x2.  The mandibular left surgical site was then closed from the distal #21 extended to mesial #24 utilizing 3-0 chromic gut suture in a continuous interrupted suture technique x1.  The mandibular right surgical site was then closed from distal #30 and extended to the mesial of #25 utilizing 3-0 chromic gut suture in a continuous interrupted suture technique x1.  At this point in time, the entire mouth was irrigated with copious amounts of sterile saline.  The patient was examined for complications, seeing none, the dental medicine procedure deemed to be complete.  The throat pack was removed at this time.  A series of 4x4 gauze were placed in the mouth to aid hemostasis.  An oral airway was then placed at the request of the anesthesia team.  The patient was then handed over to anesthesia team for final disposition.  After proper amount of time, the patient was extubated and taken to the postanesthesia care unit with stable vital signs and good oxygenation level.  All counts were correct for the dental medicine procedure.  The  patient will be seen approximately 1 week for evaluation for suture removal.  The patient has appropriate pain medication with utilization of MS Contin and oxycodoneIR as indicated for his pain.  Additional pain medication as needed can be prescribed by his medical doctors that oversee all pain control.          ______________________________ Cindra Eves, D.D.S.     RK/MEDQ  D:  03/30/2011  T:  03/30/2011  Job:  [id number]  cc:   Cindra Eves, D.D.S. Fax: [phone]  Exie Parody, M.D.  Electronically Signed by Cindra Eves D.D.S. on 04/04/2011 10:36:17 AM

## 2011-04-09 DIAGNOSIS — K006 Disturbances in tooth eruption: Secondary | ICD-10-CM

## 2011-04-09 DIAGNOSIS — K08109 Complete loss of teeth, unspecified cause, unspecified class: Secondary | ICD-10-CM

## 2011-04-09 DIAGNOSIS — K08409 Partial loss of teeth, unspecified cause, unspecified class: Secondary | ICD-10-CM

## 2011-05-11 ENCOUNTER — Other Ambulatory Visit: Payer: Self-pay | Admitting: Oncology

## 2011-05-11 ENCOUNTER — Encounter (HOSPITAL_BASED_OUTPATIENT_CLINIC_OR_DEPARTMENT_OTHER): Payer: Medicare Other | Admitting: Oncology

## 2011-05-11 DIAGNOSIS — M545 Low back pain: Secondary | ICD-10-CM

## 2011-05-11 DIAGNOSIS — Z5112 Encounter for antineoplastic immunotherapy: Secondary | ICD-10-CM

## 2011-05-11 DIAGNOSIS — C8299 Follicular lymphoma, unspecified, extranodal and solid organ sites: Secondary | ICD-10-CM

## 2011-05-11 DIAGNOSIS — R1012 Left upper quadrant pain: Secondary | ICD-10-CM

## 2011-05-11 DIAGNOSIS — R112 Nausea with vomiting, unspecified: Secondary | ICD-10-CM

## 2011-05-11 LAB — CBC WITH DIFFERENTIAL/PLATELET
Basophils Absolute: 0 10*3/uL (ref 0.0–0.1)
EOS%: 2.1 % (ref 0.0–7.0)
HGB: 15 g/dL (ref 13.0–17.1)
MCH: 29.9 pg (ref 27.2–33.4)
MCV: 82.9 fL (ref 79.3–98.0)
MONO%: 4.3 % (ref 0.0–14.0)
NEUT%: 77.4 % — ABNORMAL HIGH (ref 39.0–75.0)
RDW: 12.6 % (ref 11.0–14.6)

## 2011-05-11 LAB — COMPREHENSIVE METABOLIC PANEL
Alkaline Phosphatase: 72 U/L (ref 39–117)
BUN: 8 mg/dL (ref 6–23)
Glucose, Bld: 111 mg/dL — ABNORMAL HIGH (ref 70–99)
Sodium: 140 mEq/L (ref 135–145)
Total Bilirubin: 0.4 mg/dL (ref 0.3–1.2)

## 2011-05-14 DIAGNOSIS — K08109 Complete loss of teeth, unspecified cause, unspecified class: Secondary | ICD-10-CM

## 2011-05-22 ENCOUNTER — Encounter (HOSPITAL_COMMUNITY): Payer: Self-pay | Admitting: Dentistry

## 2011-05-23 ENCOUNTER — Encounter (HOSPITAL_COMMUNITY): Payer: Self-pay | Admitting: Dentistry

## 2011-05-23 DIAGNOSIS — K08109 Complete loss of teeth, unspecified cause, unspecified class: Secondary | ICD-10-CM

## 2011-05-31 ENCOUNTER — Encounter (HOSPITAL_COMMUNITY): Payer: Self-pay | Admitting: Dentistry

## 2011-05-31 DIAGNOSIS — K08109 Complete loss of teeth, unspecified cause, unspecified class: Secondary | ICD-10-CM

## 2011-06-11 ENCOUNTER — Encounter (HOSPITAL_COMMUNITY): Payer: Self-pay | Admitting: Dentistry

## 2011-06-11 DIAGNOSIS — K08109 Complete loss of teeth, unspecified cause, unspecified class: Secondary | ICD-10-CM

## 2011-06-17 ENCOUNTER — Encounter: Payer: Self-pay | Admitting: Oncology

## 2011-06-17 DIAGNOSIS — G8929 Other chronic pain: Secondary | ICD-10-CM | POA: Insufficient documentation

## 2011-06-17 DIAGNOSIS — M545 Low back pain: Secondary | ICD-10-CM | POA: Insufficient documentation

## 2011-06-19 ENCOUNTER — Encounter (HOSPITAL_COMMUNITY): Payer: Self-pay | Admitting: Dentistry

## 2011-06-19 ENCOUNTER — Encounter: Payer: Self-pay | Admitting: *Deleted

## 2011-06-19 DIAGNOSIS — K08109 Complete loss of teeth, unspecified cause, unspecified class: Secondary | ICD-10-CM

## 2011-06-24 ENCOUNTER — Other Ambulatory Visit: Payer: Self-pay | Admitting: Oncology

## 2011-06-24 ENCOUNTER — Encounter: Payer: Self-pay | Admitting: Oncology

## 2011-06-24 DIAGNOSIS — B1910 Unspecified viral hepatitis B without hepatic coma: Secondary | ICD-10-CM | POA: Insufficient documentation

## 2011-06-24 DIAGNOSIS — C8218 Follicular lymphoma grade II, lymph nodes of multiple sites: Secondary | ICD-10-CM

## 2011-06-24 DIAGNOSIS — C8590 Non-Hodgkin lymphoma, unspecified, unspecified site: Secondary | ICD-10-CM

## 2011-06-24 DIAGNOSIS — R768 Other specified abnormal immunological findings in serum: Secondary | ICD-10-CM

## 2011-06-26 ENCOUNTER — Other Ambulatory Visit: Payer: Self-pay | Admitting: Oncology

## 2011-06-27 ENCOUNTER — Encounter (HOSPITAL_COMMUNITY): Payer: Self-pay | Admitting: Dentistry

## 2011-07-12 ENCOUNTER — Other Ambulatory Visit: Payer: Self-pay | Admitting: Oncology

## 2011-07-12 ENCOUNTER — Telehealth: Payer: Self-pay | Admitting: *Deleted

## 2011-07-12 ENCOUNTER — Ambulatory Visit (HOSPITAL_COMMUNITY)
Admission: RE | Admit: 2011-07-12 | Discharge: 2011-07-12 | Disposition: A | Discharge status: Home / Self Care | Payer: Medicare Other | Source: Ambulatory Visit | Attending: Oncology | Admitting: Oncology

## 2011-07-12 ENCOUNTER — Encounter (HOSPITAL_COMMUNITY): Payer: Self-pay

## 2011-07-12 ENCOUNTER — Other Ambulatory Visit (HOSPITAL_BASED_OUTPATIENT_CLINIC_OR_DEPARTMENT_OTHER): Payer: Medicare Other | Admitting: Lab

## 2011-07-12 DIAGNOSIS — K838 Other specified diseases of biliary tract: Secondary | ICD-10-CM | POA: Insufficient documentation

## 2011-07-12 DIAGNOSIS — Z79899 Other long term (current) drug therapy: Secondary | ICD-10-CM | POA: Insufficient documentation

## 2011-07-12 DIAGNOSIS — C8299 Follicular lymphoma, unspecified, extranodal and solid organ sites: Secondary | ICD-10-CM

## 2011-07-12 DIAGNOSIS — C8590 Non-Hodgkin lymphoma, unspecified, unspecified site: Secondary | ICD-10-CM

## 2011-07-12 DIAGNOSIS — K8020 Calculus of gallbladder without cholecystitis without obstruction: Secondary | ICD-10-CM | POA: Insufficient documentation

## 2011-07-12 DIAGNOSIS — C8589 Other specified types of non-Hodgkin lymphoma, extranodal and solid organ sites: Secondary | ICD-10-CM | POA: Insufficient documentation

## 2011-07-12 HISTORY — DX: Malignant (primary) neoplasm, unspecified: C80.1

## 2011-07-12 LAB — CBC WITH DIFFERENTIAL/PLATELET
BASO%: 0.3 % (ref 0.0–2.0)
Eosinophils Absolute: 0.1 10*3/uL (ref 0.0–0.5)
HCT: 42.1 % (ref 38.4–49.9)
MCHC: 34.4 g/dL (ref 32.0–36.0)
MONO#: 0.4 10*3/uL (ref 0.1–0.9)
NEUT#: 3.5 10*3/uL (ref 1.5–6.5)
RBC: 4.77 10*6/uL (ref 4.20–5.82)
WBC: 4.8 10*3/uL (ref 4.0–10.3)
lymph#: 0.9 10*3/uL (ref 0.9–3.3)

## 2011-07-12 LAB — LACTATE DEHYDROGENASE: LDH: 91 U/L — ABNORMAL LOW (ref 94–250)

## 2011-07-12 LAB — COMPREHENSIVE METABOLIC PANEL
Albumin: 4.5 g/dL (ref 3.5–5.2)
Alkaline Phosphatase: 69 U/L (ref 39–117)
BUN: 6 mg/dL (ref 6–23)
CO2: 30 mEq/L (ref 19–32)
Calcium: 9.3 mg/dL (ref 8.4–10.5)
Chloride: 101 mEq/L (ref 96–112)
Glucose, Bld: 94 mg/dL (ref 70–99)
Potassium: 3.7 mEq/L (ref 3.5–5.3)
Sodium: 138 mEq/L (ref 135–145)
Total Protein: 6.2 g/dL (ref 6.0–8.3)

## 2011-07-12 MED ORDER — IOHEXOL 300 MG/ML  SOLN
100.0000 mL | Freq: Once | INTRAMUSCULAR | Status: AC | PRN
  Administered 2011-07-12: 100 mL via INTRAVENOUS

## 2011-07-12 NOTE — Telephone Encounter (Signed)
PT.'S CT CHEST, ABDOMEN, AND PELVIS REPORT WAS FAXED TO [facility]. THIS REPORT WAS GIVEN TO DR.HA.

## 2011-07-13 ENCOUNTER — Ambulatory Visit (HOSPITAL_BASED_OUTPATIENT_CLINIC_OR_DEPARTMENT_OTHER): Payer: Medicare Other | Admitting: Oncology

## 2011-07-13 ENCOUNTER — Ambulatory Visit (HOSPITAL_BASED_OUTPATIENT_CLINIC_OR_DEPARTMENT_OTHER): Payer: Medicare Other

## 2011-07-13 VITALS — BP 129/82 | HR 120 | Temp 97.5°F | Ht 70.0 in | Wt 160.2 lb

## 2011-07-13 DIAGNOSIS — C8218 Follicular lymphoma grade II, lymph nodes of multiple sites: Secondary | ICD-10-CM

## 2011-07-13 DIAGNOSIS — R634 Abnormal weight loss: Secondary | ICD-10-CM

## 2011-07-13 DIAGNOSIS — Z5112 Encounter for antineoplastic immunotherapy: Secondary | ICD-10-CM

## 2011-07-13 DIAGNOSIS — R1904 Left lower quadrant abdominal swelling, mass and lump: Secondary | ICD-10-CM

## 2011-07-13 DIAGNOSIS — C8299 Follicular lymphoma, unspecified, extranodal and solid organ sites: Secondary | ICD-10-CM

## 2011-07-13 DIAGNOSIS — C8590 Non-Hodgkin lymphoma, unspecified, unspecified site: Secondary | ICD-10-CM

## 2011-07-13 MED ORDER — SODIUM CHLORIDE 0.9 % IJ SOLN
10.0000 mL | INTRAMUSCULAR | Status: DC | PRN
  Administered 2011-07-13: 10 mL
  Filled 2011-07-13: qty 10

## 2011-07-13 MED ORDER — SODIUM CHLORIDE 0.9 % IV SOLN
Freq: Once | INTRAVENOUS | Status: AC
  Administered 2011-07-13: 10:00:00 via INTRAVENOUS

## 2011-07-13 MED ORDER — DIPHENHYDRAMINE HCL 25 MG PO CAPS
50.0000 mg | ORAL_CAPSULE | Freq: Once | ORAL | Status: AC
  Administered 2011-07-13: 50 mg via ORAL

## 2011-07-13 MED ORDER — SODIUM CHLORIDE 0.9 % IV SOLN
375.0000 mg/m2 | Freq: Once | INTRAVENOUS | Status: AC
  Administered 2011-07-13: 700 mg via INTRAVENOUS
  Filled 2011-07-13: qty 70

## 2011-07-13 MED ORDER — HEPARIN SOD (PORK) LOCK FLUSH 100 UNIT/ML IV SOLN
500.0000 [IU] | Freq: Once | INTRAVENOUS | Status: AC | PRN
  Administered 2011-07-13: 500 [IU]
  Filled 2011-07-13: qty 5

## 2011-07-13 MED ORDER — ACETAMINOPHEN 325 MG PO TABS
650.0000 mg | ORAL_TABLET | Freq: Once | ORAL | Status: AC
  Administered 2011-07-13: 650 mg via ORAL

## 2011-07-13 NOTE — Progress Notes (Signed)
Edwin Yu OFFICE PROGRESS NOTE   CC:  Edwin Yu, M.D.  Edwin Yu, M.D.   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   CURRENT THERAPY:  started on maintenance q2 month Rituxan on 08/16/2010 with plan for 2 years total.  INTERVAL HISTORY: Edwin Yu 55 y.o. male returns for regular follow up. 1.  Weight loss.  He recently had a stent if expectantly and he still learning how to use denture. He thinks that weight loss is from this. 2.  Left lower quadrant mass.  It is still persistent in the left lower quadrant. He thinks that the mass is not growing. He still has chronic constipation from taking analgesics however no hematochezia.  Otherwise he is tolerated and without problem. He denies headache, visual changes, chronic infection, productive cough, hemoptysis, hematemesis, melena, hematochezia, skin rash, focal motor weakness.  MEDICAL HISTORY: Past Medical History  Diagnosis Date  . 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  [name] Chronic lower back pain   . Hepatitis B antibody positive 2011    on Lamivudine until finish of maintenance Rituxan in 08/2012.  [name] Cancer     nhl   ALLERGIES:   has no known allergies.  MEDICATIONS:  Current Outpatient Prescriptions  Medication Sig Dispense Refill  . cyclobenzaprine (FLEXERIL) 10 MG tablet Take 10 mg by mouth every 12 (twelve) hours.        . diphenhydramine-acetaminophen (TYLENOL PM) 25-500 MG TABS Take 1 tablet by mouth at bedtime as needed.        . docusate sodium  (COLACE) 100 MG capsule Take 100 mg by mouth 2 (two) times daily as needed.        . lamivudine (EPIVIR) 100 MG tablet Take 100 mg by mouth daily.        [name] lidocaine-prilocaine (EMLA) cream Apply 1 application topically as needed. Apply to porta cath site one hour prior to needle access       . morphine (MS CONTIN) 100 MG 12 hr tablet Take 100 mg by mouth 3 (three) times daily.        [name] oxyCODONE (OXY IR/ROXICODONE) 5 MG immediate release tablet Take 5 mg by mouth every 8 (eight) hours as needed.         No current facility-administered medications for this visit.   Facility-Administered Medications Ordered in Other Visits  Medication Dose Route Frequency Provider Last Rate Last Dose  . iohexol (OMNIPAQUE) 300 MG/ML injection 100 mL  100 mL Intravenous Once PRN Medication Radiologist   100 mL at 07/12/11 1146    SURGICAL HISTORY: No past surgical history on file.  REVIEW OF SYSTEMS:  Pertinent items are noted in HPI.   PHYSICAL EXAMINATION: ECOG PERFORMANCE STATUS: 0-1  Filed Vitals:   07/13/11 0920  BP: 129/82  Pulse: 120  Temp: 97.5 F (36.4 C)    General:  Thin-appearing man in no acute distress.  Eyes:  no scleral icterus.  ENT:  There were no oropharyngeal lesions.  Neck was without thyromegaly.  Lymphatics:  Negative cervical, supraclavicular or axillary  adenopathy.  Respiratory: lungs were clear bilaterally without wheezing or crackles.  Cardiovascular:  Regular rate and rhythm, S1/S2, without murmur, rub or gallop.  There was no pedal edema.  GI:  abdomen was soft, flat, nontender, nondistended, without organomegaly.  I again was able to feel a 4x2 cm left lower quadrant mobile mass that was tender to palpation.  Muscoloskeletal:  no spinal tenderness of palpation of vertebral spine.  Skin exam was without echymosis, petichae.  Neuro exam was nonfocal.  Patient was able to get on and off exam table without assistance.  Gait was normal.  Patient was alerted and oriented.  Attention  was good.   Language was appropriate.  Mood was normal without depression.  Speech was not pressured.  Thought content was not tangential.    LABORATORY DATA: Results for orders placed in visit on 07/12/11 (from the past 48 hour(s))  LACTATE DEHYDROGENASE     Status: Abnormal (Preliminary result)   Collection Time   07/12/11 12:29 PM      Component Value Range Comment   LD 91 (*) 94 - 250 (U/L)   COMPREHENSIVE METABOLIC PANEL     Status: Normal (Preliminary result)   Collection Time   07/12/11 12:29 PM      Component Value Range Comment   Sodium 138  135 - 145 (mEq/L)    Potassium 3.7  3.5 - 5.3 (mEq/L)    Chloride 101  96 - 112 (mEq/L)    CO2 30  19 - 32 (mEq/L)    Glucose, Bld 94  70 - 99 (mg/dL)    BUN 6  6 - 23 (mg/dL)    Creatinine, Ser 7.82  0.50 - 1.35 (mg/dL)    Total Bilirubin 0.5  0.3 - 1.2 (mg/dL)    Alkaline Phosphatase 69  39 - 117 (U/L)    AST 13  0 - 37 (U/L)    ALT 12  0 - 53 (U/L)    Total Protein 6.2  6.0 - 8.3 (g/dL)    Albumin 4.5  3.5 - 5.2 (g/dL)    Calcium 9.3  8.4 - 10.5 (mg/dL)   LACTATE DEHYDROGENASE     Status: Abnormal   Collection Time   07/12/11 12:29 PM      Component Value Range Comment   LD 91 (*) 94 - 250 (U/L)   COMPREHENSIVE METABOLIC PANEL     Status: Normal   Collection Time   07/12/11 12:29 PM      Component Value Range Comment   Sodium 138  135 - 145 (mEq/L)    Potassium 3.7  3.5 - 5.3 (mEq/L)    Chloride 101  96 - 112 (mEq/L)    CO2 30  19 - 32 (mEq/L)    Glucose, Bld 94  70 - 99 (mg/dL)    BUN 6  6 - 23 (mg/dL)    Creatinine, Ser 9.56  0.50 - 1.35 (mg/dL)    Total Bilirubin 0.5  0.3 - 1.2 (mg/dL)    Alkaline Phosphatase 69  39 - 117 (U/L)    AST 13  0 - 37 (U/L)    ALT 12  0 - 53 (U/L)    Total Protein 6.2  6.0 - 8.3 (g/dL)    Albumin 4.5  3.5 - 5.2 (g/dL)    Calcium 9.3  8.4 - 10.5 (mg/dL)       RADIOGRAPHIC STUDIES:  I personally reviewed that following CT's and showed the patient the images.  There was no  evidence of  lymphoma recurrence.   ASSESSMENT AND PLAN:  1. History of lymphoma:  I discussed with Edwin Yu that he has no evidence of recurrence of lymphoma on clinical history, physical exam and laboratory tests.  I advised him to proceed with today's dose of maintenance Rituxan.  He has no complications with maintenance Rituxan. 2. Family history of colon cancer in his father.  The patient never had a colonoscopy.  He was referred to Dr. Jeani Yu for first colonoscopy.  He cannot pay the co-pay right now.  He is planning to get it done later this year for that procedure.  I advised him to keep that appointment.    Given the persistent left pelvic mass on exam, I strongly urged him to get this done as soon as possible.  He will have insurance support next January 2012.   3. Chronic low-back pain from osteoarthritis.  He is on cyclobenzaprine, morphine sulfate and oxycodone per PCP.  4. History of hepatitis B.  He is on lamivudine for the duration of Rituxan therapy.   5. Follow up with me in 2 months before that dose of Rituxan.

## 2011-07-13 NOTE — Progress Notes (Signed)
Rituxan started at rate o f47.70mL/hr x 24mL @1055   (1130) Rate increased to 94.9 x 47mL.  (1200) Rate increased to 177mL/hr x 71mL.  (1230) Rate increased to 137mL/hr x 95mL  Rate will continue at 130mL/hr for duration of infusion.

## 2011-08-14 ENCOUNTER — Other Ambulatory Visit: Payer: Self-pay | Admitting: Certified Registered Nurse Anesthetist

## 2011-09-12 ENCOUNTER — Ambulatory Visit: Payer: Self-pay | Admitting: Oncology

## 2011-09-12 ENCOUNTER — Other Ambulatory Visit: Payer: Self-pay

## 2011-09-12 ENCOUNTER — Ambulatory Visit: Payer: Self-pay

## 2011-10-01 ENCOUNTER — Telehealth: Payer: Self-pay | Admitting: Oncology

## 2011-10-01 ENCOUNTER — Ambulatory Visit (HOSPITAL_BASED_OUTPATIENT_CLINIC_OR_DEPARTMENT_OTHER): Payer: Medicare Other | Admitting: Oncology

## 2011-10-01 ENCOUNTER — Other Ambulatory Visit: Payer: Medicare Other

## 2011-10-01 ENCOUNTER — Ambulatory Visit (HOSPITAL_BASED_OUTPATIENT_CLINIC_OR_DEPARTMENT_OTHER): Payer: Medicare Other

## 2011-10-01 VITALS — BP 131/79 | HR 92 | Temp 97.6°F | Ht 70.0 in | Wt 161.2 lb

## 2011-10-01 DIAGNOSIS — C8218 Follicular lymphoma grade II, lymph nodes of multiple sites: Secondary | ICD-10-CM

## 2011-10-01 DIAGNOSIS — Z800 Family history of malignant neoplasm of digestive organs: Secondary | ICD-10-CM

## 2011-10-01 DIAGNOSIS — C8298 Follicular lymphoma, unspecified, lymph nodes of multiple sites: Secondary | ICD-10-CM

## 2011-10-01 DIAGNOSIS — Z5112 Encounter for antineoplastic immunotherapy: Secondary | ICD-10-CM

## 2011-10-01 DIAGNOSIS — C8590 Non-Hodgkin lymphoma, unspecified, unspecified site: Secondary | ICD-10-CM

## 2011-10-01 DIAGNOSIS — M1990 Unspecified osteoarthritis, unspecified site: Secondary | ICD-10-CM

## 2011-10-01 DIAGNOSIS — Z8619 Personal history of other infectious and parasitic diseases: Secondary | ICD-10-CM

## 2011-10-01 LAB — CBC WITH DIFFERENTIAL/PLATELET
Eosinophils Absolute: 0.1 10*3/uL (ref 0.0–0.5)
MONO#: 0.4 10*3/uL (ref 0.1–0.9)
NEUT#: 3.8 10*3/uL (ref 1.5–6.5)
RBC: 4.86 10*6/uL (ref 4.20–5.82)
RDW: 13.1 % (ref 11.0–14.6)
WBC: 5.4 10*3/uL (ref 4.0–10.3)
lymph#: 1 10*3/uL (ref 0.9–3.3)
nRBC: 0 % (ref 0–0)

## 2011-10-01 LAB — COMPREHENSIVE METABOLIC PANEL
ALT: 12 U/L (ref 0–53)
CO2: 29 mEq/L (ref 19–32)
Calcium: 9.5 mg/dL (ref 8.4–10.5)
Chloride: 105 mEq/L (ref 96–112)
Potassium: 4.1 mEq/L (ref 3.5–5.3)
Sodium: 141 mEq/L (ref 135–145)
Total Bilirubin: 0.5 mg/dL (ref 0.3–1.2)
Total Protein: 6.4 g/dL (ref 6.0–8.3)

## 2011-10-01 LAB — LACTATE DEHYDROGENASE: LDH: 110 U/L (ref 94–250)

## 2011-10-01 MED ORDER — SODIUM CHLORIDE 0.9 % IV SOLN
375.0000 mg/m2 | Freq: Once | INTRAVENOUS | Status: AC
  Administered 2011-10-01: 700 mg via INTRAVENOUS
  Filled 2011-10-01: qty 70

## 2011-10-01 MED ORDER — HEPARIN SOD (PORK) LOCK FLUSH 100 UNIT/ML IV SOLN
500.0000 [IU] | Freq: Once | INTRAVENOUS | Status: AC | PRN
  Administered 2011-10-01: 500 [IU]
  Filled 2011-10-01: qty 5

## 2011-10-01 MED ORDER — ACETAMINOPHEN 325 MG PO TABS
650.0000 mg | ORAL_TABLET | Freq: Once | ORAL | Status: AC
  Administered 2011-10-01: 650 mg via ORAL

## 2011-10-01 MED ORDER — DIPHENHYDRAMINE HCL 25 MG PO CAPS
50.0000 mg | ORAL_CAPSULE | Freq: Once | ORAL | Status: AC
  Administered 2011-10-01: 50 mg via ORAL

## 2011-10-01 MED ORDER — SODIUM CHLORIDE 0.9 % IJ SOLN
10.0000 mL | INTRAMUSCULAR | Status: DC | PRN
  Administered 2011-10-01: 10 mL
  Filled 2011-10-01: qty 10

## 2011-10-01 MED ORDER — SODIUM CHLORIDE 0.9 % IV SOLN
Freq: Once | INTRAVENOUS | Status: AC
  Administered 2011-10-01: 10:00:00 via INTRAVENOUS

## 2011-10-01 NOTE — Progress Notes (Signed)
VSS. Pt has no s/s of reaction.  Rituxan rate increased to 300 mg/hr= 136 ml/hr x 68 ml.

## 2011-10-01 NOTE — Progress Notes (Signed)
[facility] OFFICE PROGRESS NOTE   CC:  [name], M.D.  [name], M.D.   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   CURRENT THERAPY:  started on maintenance q2 month Rituxan on 08/16/2010 with plan for 2 years total.  INTERVAL HISTORY: Edwin Yu 56 y.o. male returns for regular follow up.  He can still palpate LLQ abdominal mass which was not visualized on CT abdomen/pelvis.  He has new insurance and plans to call Dr. Haywood Pao office himself to have a colonoscopy soon since his father had colon cancer.  He has tolerated Rituxan well.  He has low appetite and has not major weight loss since last year. He has had chronic back pain for years for which he takes MS Contin and Oxycodone.  He denies Wane Mollett, mucositis, nausea/vomiting, fever, palpable node swelling, jaundice, SOB, CP, abd pain, early satiety, abd distension, bleeding symptoms, bowel/bladder incontinence, leg paresthesia, seizure, confusion, skin rash, joint swelling.   MEDICAL HISTORY: Past Medical History  Diagnosis Date  . 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  [name] Chronic lower back pain   . Hepatitis B antibody positive 2011    on Lamivudine until finish of maintenance Rituxan in 08/2012.  [name] Cancer     nhl   ALLERGIES:   has no known allergies.  MEDICATIONS:  Current Outpatient Prescriptions  Medication Sig Dispense Refill  . cyclobenzaprine (FLEXERIL) 10 MG tablet Take 10 mg by mouth every 12 (twelve) hours.        . diphenhydramine-acetaminophen (TYLENOL  PM) 25-500 MG TABS Take 1 tablet by mouth at bedtime as needed.        . docusate sodium (COLACE) 100 MG capsule Take 100 mg by mouth 2 (two) times daily as needed.        . lamivudine (EPIVIR) 100 MG tablet Take 100 mg by mouth daily.        [name] lidocaine-prilocaine (EMLA) cream Apply 1 application topically as needed. Apply to porta cath site one hour prior to needle access       . morphine (MS CONTIN) 100 MG 12 hr tablet Take 100 mg by mouth 3 (three) times daily.        [name] oxyCODONE (OXY IR/ROXICODONE) 5 MG immediate release tablet Take 5 mg by mouth every 8 (eight) hours as needed.         No current facility-administered medications for this visit.   Facility-Administered Medications Ordered in Other Visits  Medication Dose Route Frequency Provider Last Rate Last Dose  . 0.9 %  sodium chloride infusion   Intravenous Once Jethro Bolus, MD      . acetaminophen (TYLENOL) tablet 650 mg  650 mg Oral Once Jethro Bolus, MD   650 mg at 10/01/11 1008  . diphenhydrAMINE (BENADRYL) capsule 50 mg  50 mg Oral Once Jethro Bolus, MD   50 mg at 10/01/11 1008  . heparin lock flush 100 unit/mL  500 Units Intracatheter Once PRN Jethro Bolus, MD   500 Units at 10/01/11 1335  . riTUXimab (RITUXAN) 700 mg in  sodium chloride 0.9 % 250 mL chemo infusion  375 mg/m2 (Treatment Plan Actual) Intravenous Once Jethro Bolus, MD   700 mg at 10/01/11 1040  . sodium chloride 0.9 % injection 10 mL  10 mL Intracatheter PRN Jethro Bolus, MD   10 mL at 10/01/11 1335    SURGICAL HISTORY: No past surgical history on file.  REVIEW OF SYSTEMS:  Pertinent items are noted in HPI.   PHYSICAL EXAMINATION: ECOG PERFORMANCE STATUS: 0-1  Filed Vitals:   10/01/11 0915  BP: 131/79  Pulse: 92  Temp: 97.6 F (36.4 C)    General:  Thin-appearing man in no acute distress.  Eyes:  no scleral icterus.  ENT:  There were no oropharyngeal lesions.  Neck was without thyromegaly.  Lymphatics:  Negative cervical, supraclavicular or axillary adenopathy.  Respiratory:  lungs were clear bilaterally without wheezing or crackles.  Cardiovascular:  Regular rate and rhythm, S1/S2, without murmur, rub or gallop.  There was no pedal edema.  GI:  abdomen was soft, flat, nontender, nondistended, without organomegaly.  I again was able to feel a 4x2 cm left lower quadrant mobile mass that was tender to palpation.  Muscoloskeletal:  no spinal tenderness of palpation of vertebral spine.  Skin exam was without echymosis, petichae.  Neuro exam was nonfocal.  Patient was able to get on and off exam table without assistance.  Gait was normal.  Patient was alerted and oriented.  Attention was good.   Language was appropriate.  Mood was normal without depression.  Speech was not pressured.  Thought content was not tangential.    LABORATORY DATA: Results for orders placed in visit on 10/01/11 (from the past 48 hour(s))  CBC WITH DIFFERENTIAL     Status: Normal   Collection Time   10/01/11  8:59 AM      Component Value Range Comment   WBC 5.4  4.0 - 10.3 (10e3/uL)    NEUT# 3.8  1.5 - 6.5 (10e3/uL)    HGB 14.5  13.0 - 17.1 (g/dL)    HCT 40.9  81.1 - 91.4 (%)    Platelets 207  140 - 400 (10e3/uL)    MCV 87.4  79.3 - 98.0 (fL)    MCH 29.8  27.2 - 33.4 (pg)    MCHC 34.1  32.0 - 36.0 (g/dL)    RBC 7.82  9.56 - 2.13 (10e6/uL)    RDW 13.1  11.0 - 14.6 (%)    lymph# 1.0  0.9 - 3.3 (10e3/uL)    MONO# 0.4  0.1 - 0.9 (10e3/uL)    Eosinophils Absolute 0.1  0.0 - 0.5 (10e3/uL)    Basophils Absolute 0.0  0.0 - 0.1 (10e3/uL)    NEUT% 71.5  39.0 - 75.0 (%)    LYMPH% 18.7  14.0 - 49.0 (%)    MONO% 7.1  0.0 - 14.0 (%)    EOS% 2.1  0.0 - 7.0 (%)    BASO% 0.6  0.0 - 2.0 (%)    nRBC 0  0 - 0 (%)   COMPREHENSIVE METABOLIC PANEL     Status: Abnormal   Collection Time   10/01/11  8:59 AM      Component Value Range Comment   Sodium 141  135 - 145 (mEq/L)    Potassium 4.1  3.5 - 5.3 (mEq/L)    Chloride 105  96 - 112 (mEq/L)    CO2 29  19 - 32 (mEq/L)    Glucose, Bld 100 (*) 70 - 99 (mg/dL)  BUN 7  6 - 23 (mg/dL)    Creatinine, Ser 1.91  0.50 - 1.35 (mg/dL)    Total Bilirubin 0.5  0.3 - 1.2 (mg/dL)    Alkaline Phosphatase 69  39 - 117 (U/L)    AST 13  0 - 37 (U/L)    ALT 12  0 - 53 (U/L)    Total Protein 6.4  6.0 - 8.3 (g/dL)    Albumin 4.6  3.5 - 5.2 (g/dL)    Calcium 9.5  8.4 - 10.5 (mg/dL)   LACTATE DEHYDROGENASE     Status: Normal   Collection Time   10/01/11  8:59 AM      Component Value Range Comment   LD 110  94 - 250 (U/L)       ASSESSMENT AND PLAN:  1. History of lymphoma:  I discussed with Mr. Tally that he has no evidence of recurrence of lymphoma on clinical history, physical exam and laboratory tests.  I advised him to proceed with today's dose of maintenance Rituxan.  He has no complications with maintenance Rituxan. 2. Family history of colon cancer in his father with a palpable LLQ abdominal mass.  The patient never had a colonoscopy.  He was referred to Dr. [name] for first colonoscopy.  He cannot pay the co-pay right now.  He is planning to get it done later this year for that procedure.  I advised him to keep that appointment.    Given the persistent left pelvic mass on exam, I strongly urged him to get this done as soon as possible with Dr. Elnoria Howard.  He was seen by Gen Surg and since this mass was not evident on CT, there was no role for Gen Surg.  3. Chronic low-back pain from osteoarthritis.  He is on cyclobenzaprine, morphine sulfate and oxycodone per PCP.  4. History of hepatitis B.  He is on lamivudine for the duration of Rituxan therapy.   5. Follow up with me in 2 months before that dose of Rituxan.    The length of time of the face-to-face encounter was . More than 50% of time was spent counseling and coordination of care.

## 2011-10-01 NOTE — Progress Notes (Signed)
VSS. Pt has no s/s of reaction. Rituxan rate increased to 200 mg/hr= 91 ml/hr x 45 ml.

## 2011-10-01 NOTE — Progress Notes (Signed)
1040- Rituxan started at rate of 100 mg/hr= 45 ml/hr x 23 ml.

## 2011-10-01 NOTE — Telephone Encounter (Signed)
APPT MOVED FROM 3/8 TO 3/27 PRINTED FOR PT    [facility]

## 2011-10-01 NOTE — Progress Notes (Signed)
VSS. Pt has no s/s of reaction.  Rate continued at 182 ml/hr x remainder of infusion.

## 2011-10-01 NOTE — Progress Notes (Signed)
Vss. Pt has no s/s of reaction. rituxan rate increased to 400 mg/hr= 182 ml/hr x 91 ml

## 2011-10-01 NOTE — Patient Instructions (Signed)
Pt aware of next appt and knows to call office if any problems. 

## 2011-11-09 ENCOUNTER — Other Ambulatory Visit: Payer: Self-pay | Admitting: Lab

## 2011-11-09 ENCOUNTER — Ambulatory Visit: Payer: Self-pay

## 2011-11-09 ENCOUNTER — Ambulatory Visit: Payer: Self-pay | Admitting: Oncology

## 2011-11-13 ENCOUNTER — Telehealth: Payer: Self-pay | Admitting: *Deleted

## 2011-11-13 ENCOUNTER — Ambulatory Visit (HOSPITAL_BASED_OUTPATIENT_CLINIC_OR_DEPARTMENT_OTHER): Payer: Medicare Other | Admitting: Oncology

## 2011-11-13 ENCOUNTER — Ambulatory Visit (HOSPITAL_BASED_OUTPATIENT_CLINIC_OR_DEPARTMENT_OTHER): Payer: Medicare Other | Admitting: Lab

## 2011-11-13 ENCOUNTER — Other Ambulatory Visit: Payer: Self-pay | Admitting: Oncology

## 2011-11-13 ENCOUNTER — Other Ambulatory Visit: Payer: Self-pay | Admitting: *Deleted

## 2011-11-13 DIAGNOSIS — C8590 Non-Hodgkin lymphoma, unspecified, unspecified site: Secondary | ICD-10-CM

## 2011-11-13 DIAGNOSIS — C8298 Follicular lymphoma, unspecified, lymph nodes of multiple sites: Secondary | ICD-10-CM

## 2011-11-13 DIAGNOSIS — K1120 Sialoadenitis, unspecified: Secondary | ICD-10-CM | POA: Insufficient documentation

## 2011-11-13 DIAGNOSIS — R1012 Left upper quadrant pain: Secondary | ICD-10-CM

## 2011-11-13 DIAGNOSIS — C8218 Follicular lymphoma grade II, lymph nodes of multiple sites: Secondary | ICD-10-CM

## 2011-11-13 LAB — COMPREHENSIVE METABOLIC PANEL
Albumin: 4.3 g/dL (ref 3.5–5.2)
Alkaline Phosphatase: 69 U/L (ref 39–117)
BUN: 8 mg/dL (ref 6–23)
Calcium: 10 mg/dL (ref 8.4–10.5)
Chloride: 102 mEq/L (ref 96–112)
Glucose, Bld: 114 mg/dL — ABNORMAL HIGH (ref 70–99)
Potassium: 3.9 mEq/L (ref 3.5–5.3)

## 2011-11-13 LAB — CBC WITH DIFFERENTIAL/PLATELET
Basophils Absolute: 0 10*3/uL (ref 0.0–0.1)
Eosinophils Absolute: 0.2 10*3/uL (ref 0.0–0.5)
HGB: 13.6 g/dL (ref 13.0–17.1)
MCV: 87.6 fL (ref 79.3–98.0)
MONO%: 9.4 % (ref 0.0–14.0)
NEUT#: 3.8 10*3/uL (ref 1.5–6.5)
RDW: 13.5 % (ref 11.0–14.6)

## 2011-11-13 LAB — LACTATE DEHYDROGENASE: LDH: 115 U/L (ref 94–250)

## 2011-11-13 NOTE — Telephone Encounter (Signed)
I can see him today if he can make it for a work in appointment.  If not, it will have to be next week.  Thanks.

## 2011-11-13 NOTE — Telephone Encounter (Signed)
Pt left VM states some new swelling in his neck.  Called pt back and he did not answer.  I left VM requesting he call us back again.

## 2011-11-13 NOTE — Telephone Encounter (Signed)
Pt states can come in today at 1 pm for labs and see Dr. Gaylyn Rong at 1:30 pm.  Sent POF to scheduling and notified Dory Peru of changes to schedule.

## 2011-11-13 NOTE — Telephone Encounter (Signed)
Pt called back and reports onset of swelling bilat neck under jaw line x 2 days,  Getting progressively worse.  States feels like lymph nodes are swollen and they are tender and sore to touch.  Denies any sore throat or fevers.  Also feels muscle stiffness on one side and difficult to turn his head.

## 2011-11-13 NOTE — Progress Notes (Signed)
[facility] OFFICE PROGRESS NOTE   CC:  Edwin [facility], M.D.  [facility], M.D.   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   CURRENT THERAPY:  started on maintenance q2 month Rituxan on 08/16/2010 with plan for 2 years total.  INTERVAL HISTORY: Edwin Yu 56 y.o. male returns for a work in appointment.  He complained of 1 week duration of bilateral neck swelling and discomfort.  He denies nodal swelling anywhere else.  He denies fever, mucositis, SOB, DOE, night sweat.  He has chronic lower back pain before diagnosis of lymphoma.  He has mild fatigue; however, his level of fatigue has been stable for years.    MEDICAL HISTORY: Past Medical History  Diagnosis Date  . 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  [name] Chronic lower back pain   . Hepatitis B antibody positive 2011    on Lamivudine until finish of maintenance Rituxan in 08/2012.  [name] Cancer     nhl   ALLERGIES:   has no known allergies.  MEDICATIONS:  Current Outpatient Prescriptions  Medication Sig Dispense Refill  . cyclobenzaprine (FLEXERIL) 10 MG tablet Take 10 mg by mouth every 12 (twelve) hours.        . diphenhydramine-acetaminophen (TYLENOL PM) 25-500 MG TABS Take 1 tablet by mouth at bedtime as needed.        . lamivudine (EPIVIR) 100 MG tablet Take 100 mg by mouth daily.        [name] lidocaine-prilocaine (EMLA) cream Apply 1 application topically as needed. Apply to porta cath site one hour prior to needle access       . morphine (MS CONTIN) 100 MG 12 hr tablet  Take 100 mg by mouth 3 (three) times daily.        [name] oxyCODONE (OXY IR/ROXICODONE) 5 MG immediate release tablet Take 5 mg by mouth every 8 (eight) hours as needed.          SURGICAL HISTORY: No past surgical history on file.  REVIEW OF SYSTEMS:  Pertinent items are noted in HPI.   PHYSICAL EXAMINATION: ECOG PERFORMANCE STATUS: 0-1  Filed Vitals:   11/13/11 1340  BP: 120/77  Pulse: 97  Temp: 99 F (37.2 C)    General:  Thin-appearing man in no acute distress.  Eyes:  no scleral icterus.  ENT:  There were no oropharyngeal lesions.  Neck was without thyromegaly.  Lymphatics:  Negative cervical, supraclavicular or axillary adenopathy.  He has slight tenderness to palpation of bilateral salivary glands.  There was no skin breakdown, erythema, or purulent discharge.  Respiratory: lungs were clear bilaterally without wheezing or crackles.  Cardiovascular:  Regular rate and rhythm, S1/S2, without murmur, rub or gallop.  There was no pedal edema.  GI:  abdomen was soft, flat, nontender, nondistended, without organomegaly.  I again was able to feel a 4x2 cm left lower quadrant mobile mass that was tender to palpation.  Muscoloskeletal:  no spinal tenderness of palpation of vertebral spine.  Skin exam was without echymosis, petichae.  Neuro exam was nonfocal.  Patient  was able to get on and off exam table without assistance.  Gait was normal.  Patient was alerted and oriented.  Attention was good.   Language was appropriate.  Mood was normal without depression.  Speech was not pressured.  Thought content was not tangential.    LABORATORY DATA: Results for orders placed in visit on 11/13/11 (from the past 48 hour(s))  CBC WITH DIFFERENTIAL     Status: Abnormal   Collection Time   11/13/11  1:14 PM      Component Value Range Comment   WBC 5.2  4.0 - 10.3 (10e3/uL)    NEUT# 3.8  1.5 - 6.5 (10e3/uL)    HGB 13.6  13.0 - 17.1 (g/dL)    HCT 40.9  81.1 - 91.4 (%)    Platelets 182  140 - 400 (10e3/uL)     MCV 87.6  79.3 - 98.0 (fL)    MCH 30.2  27.2 - 33.4 (pg)    MCHC 34.5  32.0 - 36.0 (g/dL)    RBC 7.82  9.56 - 2.13 (10e6/uL)    RDW 13.5  11.0 - 14.6 (%)    lymph# 0.7 (*) 0.9 - 3.3 (10e3/uL)    MONO# 0.5  0.1 - 0.9 (10e3/uL)    Eosinophils Absolute 0.2  0.0 - 0.5 (10e3/uL)    Basophils Absolute 0.0  0.0 - 0.1 (10e3/uL)    NEUT% 73.8  39.0 - 75.0 (%)    LYMPH% 12.9 (*) 14.0 - 49.0 (%)    MONO% 9.4  0.0 - 14.0 (%)    EOS% 3.5  0.0 - 7.0 (%)    BASO% 0.4  0.0 - 2.0 (%)       ASSESSMENT AND PLAN:  1. Bilateral salivary gland swelling and tenderness:  Unknown etiology to cause inflammatory response.  However, I have very low clinical concern for lymphoma at this time.  He has no fever, no leukocytosis.  I thus also have low clinical concern for acute infection.  I advised him to alternate Tylenol and Ibuprofen to achieve some anti-inflammatory control.   2. History of lymphoma: He is due to maintenance Rituxan next dose is due in about 2 weeks.  3. Family history of colon cancer in his father with a palpable LLQ abdominal mass.  He is due to see Dr. Elnoria Howard later this month.  4. Chronic low-back pain from osteoarthritis.  He is on cyclobenzaprine, morphine sulfate and oxycodone per PCP.  5. History of hepatitis B.  He is on lamivudine for the duration of Rituxan therapy.   6. Follow up with me in 2 months before that dose of Rituxan.    The length of time of the face-to-face encounter was . More than 50% of time was spent counseling and coordination of care.

## 2011-11-22 ENCOUNTER — Other Ambulatory Visit: Payer: Self-pay | Admitting: Oncology

## 2011-11-27 NOTE — Progress Notes (Signed)
Received office notes from Dr. Patrick Hung @ [facility]; forwarded to Dr. Ha. 

## 2011-11-28 ENCOUNTER — Other Ambulatory Visit (HOSPITAL_BASED_OUTPATIENT_CLINIC_OR_DEPARTMENT_OTHER): Payer: Medicare Other | Admitting: Lab

## 2011-11-28 ENCOUNTER — Ambulatory Visit (HOSPITAL_BASED_OUTPATIENT_CLINIC_OR_DEPARTMENT_OTHER): Payer: Medicare Other

## 2011-11-28 ENCOUNTER — Ambulatory Visit (HOSPITAL_BASED_OUTPATIENT_CLINIC_OR_DEPARTMENT_OTHER): Payer: Medicare Other | Admitting: Oncology

## 2011-11-28 ENCOUNTER — Telehealth: Payer: Self-pay | Admitting: Oncology

## 2011-11-28 VITALS — BP 110/70 | HR 97 | Temp 97.4°F | Ht 70.0 in | Wt 158.7 lb

## 2011-11-28 VITALS — BP 117/75 | HR 77 | Temp 98.0°F

## 2011-11-28 DIAGNOSIS — Z800 Family history of malignant neoplasm of digestive organs: Secondary | ICD-10-CM

## 2011-11-28 DIAGNOSIS — Z87898 Personal history of other specified conditions: Secondary | ICD-10-CM

## 2011-11-28 DIAGNOSIS — C8590 Non-Hodgkin lymphoma, unspecified, unspecified site: Secondary | ICD-10-CM

## 2011-11-28 DIAGNOSIS — C8218 Follicular lymphoma grade II, lymph nodes of multiple sites: Secondary | ICD-10-CM

## 2011-11-28 DIAGNOSIS — Z5112 Encounter for antineoplastic immunotherapy: Secondary | ICD-10-CM

## 2011-11-28 DIAGNOSIS — C8298 Follicular lymphoma, unspecified, lymph nodes of multiple sites: Secondary | ICD-10-CM

## 2011-11-28 DIAGNOSIS — M479 Spondylosis, unspecified: Secondary | ICD-10-CM

## 2011-11-28 DIAGNOSIS — R221 Localized swelling, mass and lump, neck: Secondary | ICD-10-CM

## 2011-11-28 DIAGNOSIS — C8589 Other specified types of non-Hodgkin lymphoma, extranodal and solid organ sites: Secondary | ICD-10-CM

## 2011-11-28 DIAGNOSIS — R220 Localized swelling, mass and lump, head: Secondary | ICD-10-CM

## 2011-11-28 LAB — COMPREHENSIVE METABOLIC PANEL
ALT: 35 U/L (ref 0–53)
AST: 21 U/L (ref 0–37)
CO2: 30 mEq/L (ref 19–32)
Calcium: 9.4 mg/dL (ref 8.4–10.5)
Chloride: 101 mEq/L (ref 96–112)
Sodium: 139 mEq/L (ref 135–145)
Total Bilirubin: 0.3 mg/dL (ref 0.3–1.2)
Total Protein: 6.5 g/dL (ref 6.0–8.3)

## 2011-11-28 LAB — CBC WITH DIFFERENTIAL/PLATELET
Eosinophils Absolute: 0.1 10*3/uL (ref 0.0–0.5)
MONO#: 0.9 10*3/uL (ref 0.1–0.9)
NEUT#: 6.4 10*3/uL (ref 1.5–6.5)
RBC: 4.4 10*6/uL (ref 4.20–5.82)
RDW: 12.9 % (ref 11.0–14.6)
WBC: 8.7 10*3/uL (ref 4.0–10.3)
lymph#: 1.3 10*3/uL (ref 0.9–3.3)
nRBC: 0 % (ref 0–0)

## 2011-11-28 LAB — LACTATE DEHYDROGENASE: LDH: 142 U/L (ref 94–250)

## 2011-11-28 MED ORDER — SODIUM CHLORIDE 0.9 % IV SOLN
Freq: Once | INTRAVENOUS | Status: AC
  Administered 2011-11-28: 100 mL via INTRAVENOUS

## 2011-11-28 MED ORDER — SODIUM CHLORIDE 0.9 % IJ SOLN
10.0000 mL | INTRAMUSCULAR | Status: DC | PRN
  Administered 2011-11-28: 10 mL
  Filled 2011-11-28: qty 10

## 2011-11-28 MED ORDER — DIPHENHYDRAMINE HCL 25 MG PO CAPS
50.0000 mg | ORAL_CAPSULE | Freq: Once | ORAL | Status: AC
  Administered 2011-11-28: 50 mg via ORAL

## 2011-11-28 MED ORDER — ACETAMINOPHEN 325 MG PO TABS
650.0000 mg | ORAL_TABLET | Freq: Once | ORAL | Status: AC
  Administered 2011-11-28: 650 mg via ORAL

## 2011-11-28 MED ORDER — HEPARIN SOD (PORK) LOCK FLUSH 100 UNIT/ML IV SOLN
500.0000 [IU] | Freq: Once | INTRAVENOUS | Status: AC | PRN
  Administered 2011-11-28: 500 [IU]
  Filled 2011-11-28: qty 5

## 2011-11-28 MED ORDER — SODIUM CHLORIDE 0.9 % IV SOLN
375.0000 mg/m2 | Freq: Once | INTRAVENOUS | Status: AC
  Administered 2011-11-28: 700 mg via INTRAVENOUS
  Filled 2011-11-28: qty 70

## 2011-11-28 NOTE — Telephone Encounter (Signed)
Gv pt appts for may-july 2013.  scheduled u/s of the eck for 04/01 @ [facility].  scheduled ct scan on 05/21 @ [facility].  scheduled appt with Dr. Pollyann Kennedy on 12/07/2011 @ [facility].  Gv pt infomation for smoking cession classes

## 2011-11-28 NOTE — Patient Instructions (Signed)
[facility] Discharge Instructions for Patients Receiving Chemotherapy  Today you received the following chemotherapy agents Rituxan   To help prevent nausea and vomiting after your treatment, we encourage you to take your nausea medication as prescribed by Dr. Gaylyn Rong. If you develop nausea and vomiting that is not controlled by your nausea medication, call the clinic. If it is after clinic hours your family physician or the after hours number for the clinic or go to the Emergency Department.   BELOW ARE SYMPTOMS THAT SHOULD BE REPORTED IMMEDIATELY:  *FEVER GREATER THAN 100.5 F  *CHILLS WITH OR WITHOUT FEVER  NAUSEA AND VOMITING THAT IS NOT CONTROLLED WITH YOUR NAUSEA MEDICATION  *UNUSUAL SHORTNESS OF BREATH  *UNUSUAL BRUISING OR BLEEDING  TENDERNESS IN MOUTH AND THROAT WITH OR WITHOUT PRESENCE OF ULCERS  *URINARY PROBLEMS  *BOWEL PROBLEMS  UNUSUAL RASH Items with * indicate a potential emergency and should be followed up as soon as possible.   Feel free to call the clinic you have any questions or concerns. The clinic phone number is [phone].   I have been informed and understand all the instructions given to me. I know to contact the clinic, my physician, or go to the Emergency Department if any problems should occur. I do not have any questions at this time, but understand that I may call the clinic during office hours   should I have any questions or need assistance in obtaining follow up care.    __________________________________________  _____________  __________ Signature of Patient or Authorized Representative            Date                   Time    __________________________________________ Nurse's Signature

## 2011-11-28 NOTE — Progress Notes (Signed)
[facility] OFFICE PROGRESS NOTE   CC: Bosie Clos, MD, MD [name], M.D. [name], M.D.   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   CURRENT THERAPY:  started on maintenance q2 month Rituxan on 08/16/2010 with plan for 2 years total.  INTERVAL HISTORY: Berley Gambrell 56 y.o. male was seen by me two weeks ago.  Since then, he has had much more significant swelling and pain of the left cervical neck.  He was given a course of empiric Augmentin 7 days ago for 10 day-course without improvement.  He also had fever up to 101F for a few days.  He is afebrile today.  He has no other palpable adenopathy anywhere else.  He denies head ache, mucositis, nasal congestion, cough, hemoptysis, diarrhea, abdominal pain, abdominal swelling, hematuria, dysuria, pyuria, skin rash.  He has chronic back pain and takes pain med with pain service.   MEDICAL HISTORY: Past Medical History  Diagnosis Date  . 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  [name] Chronic lower back pain   . Hepatitis B antibody positive 2011    on Lamivudine until finish of maintenance Rituxan in 08/2012.  [name] Cancer     nhl   ALLERGIES:   has no known allergies.  MEDICATIONS:  Current Outpatient Prescriptions  Medication Sig Dispense Refill  . amoxicillin-clavulanate (AUGMENTIN) 875-125 MG per tablet Take 1 tablet by mouth 2 (two) times daily.      . cyclobenzaprine (FLEXERIL) 10 MG tablet Take 10 mg by mouth every 12 (twelve) hours.        .  diphenhydramine-acetaminophen (TYLENOL PM) 25-500 MG TABS Take 1 tablet by mouth at bedtime as needed.        . lamivudine (EPIVIR) 100 MG tablet Take 100 mg by mouth daily.        [name] lidocaine-prilocaine (EMLA) cream Apply 1 application topically as needed. Apply to porta cath site one hour prior to needle access       . morphine (MS CONTIN) 100 MG 12 hr tablet Take 100 mg by mouth 3 (three) times daily.        [name] oxyCODONE (OXY IR/ROXICODONE) 5 MG immediate release tablet Take 5 mg by mouth every 8 (eight) hours as needed.         No current facility-administered medications for this visit.   Facility-Administered Medications Ordered in Other Visits  Medication Dose Route Frequency Provider Last Rate Last Dose  . 0.9 %  sodium chloride infusion   Intravenous Once Exie Parody, MD 20 mL/hr at 11/28/11 1020 100 mL at 11/28/11 1020  . acetaminophen (TYLENOL) tablet 650 mg  650 mg Oral Once Exie Parody, MD   650 mg at 11/28/11 1022  . diphenhydrAMINE (BENADRYL) capsule 50 mg  50 mg Oral Once Exie Parody, MD   50 mg at 11/28/11 1022  . heparin lock flush 100 unit/mL  500 Units Intracatheter Once PRN Exie Parody, MD      . riTUXimab (RITUXAN) 700 mg in sodium chloride 0.9 % 250 mL  chemo infusion  375 mg/m2 (Treatment Plan Actual) Intravenous Once Exie Parody, MD      . sodium chloride 0.9 % injection 10 mL  10 mL Intracatheter PRN Exie Parody, MD        SURGICAL HISTORY: No past surgical history on file.  REVIEW OF SYSTEMS:  Pertinent items are noted in HPI.   PHYSICAL EXAMINATION: ECOG PERFORMANCE STATUS: 0-1  Filed Vitals:   11/28/11 0904  BP: 110/70  Pulse: 97  Temp: 97.4 F (36.3 C)    General:  Thin-appearing man in no acute distress.  Eyes:  no scleral icterus.  ENT:  There were no oropharyngeal lesions.  Neck was without thyromegaly.  Lymphatics:  Negative cervical, supraclavicular or axillary adenopathy.  He has significant swelling of left submandibular salivary gland externally about 5cm that  was tender to palpation.  His left salivary gland seemed to enlarged and tender on palpation at the base of the mandible.    There was no skin breakdown, erythema, or purulent discharge.  Respiratory: lungs were clear bilaterally without wheezing or crackles.  Cardiovascular:  Regular rate and rhythm, S1/S2, without murmur, rub or gallop.  There was no pedal edema.  GI:  abdomen was soft, flat, nontender, nondistended, without organomegaly.  I again was able to feel a 4x2 cm left lower quadrant mobile mass that was tender to palpation.  Muscoloskeletal:  no spinal tenderness of palpation of vertebral spine.  Skin exam was without echymosis, petichae.  Neuro exam was nonfocal.  Patient was able to get on and off exam table without assistance.  Gait was normal.  Patient was alerted and oriented.  Attention was good.   Language was appropriate.  Mood was normal without depression.  Speech was not pressured.  Thought content was not tangential.    LABORATORY DATA: Results for orders placed in visit on 11/28/11 (from the past 48 hour(s))  CBC WITH DIFFERENTIAL     Status: Abnormal   Collection Time   11/28/11  8:45 AM      Component Value Range Comment   WBC 8.7  4.0 - 10.3 (10e3/uL)    NEUT# 6.4  1.5 - 6.5 (10e3/uL)    HGB 12.7 (*) 13.0 - 17.1 (g/dL)    HCT 40.9 (*) 81.1 - 49.9 (%)    Platelets 293  140 - 400 (10e3/uL)    MCV 84.8  79.3 - 98.0 (fL)    MCH 28.9  27.2 - 33.4 (pg)    MCHC 34.0  32.0 - 36.0 (g/dL)    RBC 9.14  7.82 - 9.56 (10e6/uL)    RDW 12.9  11.0 - 14.6 (%)    lymph# 1.3  0.9 - 3.3 (10e3/uL)    MONO# 0.9  0.1 - 0.9 (10e3/uL)    Eosinophils Absolute 0.1  0.0 - 0.5 (10e3/uL)    Basophils Absolute 0.0  0.0 - 0.1 (10e3/uL)    NEUT% 73.7  39.0 - 75.0 (%)    LYMPH% 15.4  14.0 - 49.0 (%)    MONO% 10.0  0.0 - 14.0 (%)    EOS% 0.7  0.0 - 7.0 (%)    BASO% 0.2  0.0 - 2.0 (%)    nRBC 0  0 - 0 (%)       ASSESSMENT AND PLAN:  1. Left salivary gland swelling and tenderness: Most likely  blocked stone in the salivary gland.  However, he still smokes and has history of lymphoma.  HNSCC and lymphoma are also on  the differential.  I requested US of the neck within 1 wk and referred him to Dr. Serena Colonel from ENT for further evaluation.   2. History of lymphoma: He would like to proceed with Rituxan today.  He has not had any side effect from maintenance Rituxan.  I ordered chest/abd/pel CT for surveillance to be done in May 2013.   3. Family history of colon cancer in his father with a palpable LLQ abdominal mass.  He saw Dr. Elnoria Howard and will have a colonoscopy later this month.  4. Chronic low-back pain from osteoarthritis.  He is on cyclobenzaprine, morphine sulfate and oxycodone per PCP.  5. History of hepatitis B.  He is on lamivudine for the duration of Rituxan therapy.   6. Smoking:  I advised him of the risk of smoking in causing cancer, and vascular complications.  He does not want pharmacologic intervention now.  He did not like smokeless cigarettes since he coughed with that.  I referred him and his wife to smoking cessation class.       The length of time of the face-to-face encounter was 25 minutes. More than 50% of time was spent counseling and coordination of care.

## 2011-12-03 ENCOUNTER — Ambulatory Visit (HOSPITAL_COMMUNITY)
Admission: RE | Admit: 2011-12-03 | Discharge: 2011-12-03 | Disposition: A | Discharge status: Home / Self Care | Payer: Medicare Other | Source: Ambulatory Visit | Attending: Oncology | Admitting: Oncology

## 2011-12-03 DIAGNOSIS — R221 Localized swelling, mass and lump, neck: Secondary | ICD-10-CM

## 2011-12-03 DIAGNOSIS — R220 Localized swelling, mass and lump, head: Secondary | ICD-10-CM | POA: Insufficient documentation

## 2011-12-03 DIAGNOSIS — C8589 Other specified types of non-Hodgkin lymphoma, extranodal and solid organ sites: Secondary | ICD-10-CM | POA: Insufficient documentation

## 2011-12-03 DIAGNOSIS — C8590 Non-Hodgkin lymphoma, unspecified, unspecified site: Secondary | ICD-10-CM

## 2011-12-07 ENCOUNTER — Other Ambulatory Visit (HOSPITAL_COMMUNITY)
Admission: RE | Admit: 2011-12-07 | Discharge: 2011-12-07 | Disposition: A | Discharge status: Home / Self Care | Payer: Medicare Other | Source: Ambulatory Visit | Attending: Otolaryngology | Admitting: Otolaryngology

## 2011-12-07 ENCOUNTER — Telehealth: Payer: Self-pay | Admitting: *Deleted

## 2011-12-07 ENCOUNTER — Other Ambulatory Visit: Payer: Self-pay | Admitting: Otolaryngology

## 2011-12-07 DIAGNOSIS — R221 Localized swelling, mass and lump, neck: Secondary | ICD-10-CM | POA: Insufficient documentation

## 2011-12-07 DIAGNOSIS — R220 Localized swelling, mass and lump, head: Secondary | ICD-10-CM | POA: Insufficient documentation

## 2011-12-07 NOTE — Telephone Encounter (Signed)
Patient referred by Dr. Gaylyn Rong to smoking cessation classes. Edwin Yu Smoking Cessation class facilitator called patient 12/07/11 to let patient know about our smoking cessation program at the [facility]. Patient interested in signing up by does not have reliable transportation. Patient was given resources that are available on the [employer] site along with questions answered.

## 2012-01-16 ENCOUNTER — Other Ambulatory Visit: Payer: Self-pay | Admitting: Oncology

## 2012-01-16 DIAGNOSIS — C8218 Follicular lymphoma grade II, lymph nodes of multiple sites: Secondary | ICD-10-CM

## 2012-01-16 DIAGNOSIS — C8590 Non-Hodgkin lymphoma, unspecified, unspecified site: Secondary | ICD-10-CM

## 2012-01-22 ENCOUNTER — Telehealth: Payer: Self-pay | Admitting: *Deleted

## 2012-01-22 ENCOUNTER — Ambulatory Visit (HOSPITAL_COMMUNITY)
Admission: RE | Admit: 2012-01-22 | Discharge: 2012-01-22 | Disposition: A | Discharge status: Home / Self Care | Payer: Medicare Other | Source: Ambulatory Visit | Attending: Oncology | Admitting: Oncology

## 2012-01-22 ENCOUNTER — Other Ambulatory Visit (HOSPITAL_BASED_OUTPATIENT_CLINIC_OR_DEPARTMENT_OTHER): Payer: Medicare Other | Admitting: Lab

## 2012-01-22 DIAGNOSIS — C8589 Other specified types of non-Hodgkin lymphoma, extranodal and solid organ sites: Secondary | ICD-10-CM | POA: Insufficient documentation

## 2012-01-22 DIAGNOSIS — R109 Unspecified abdominal pain: Secondary | ICD-10-CM | POA: Insufficient documentation

## 2012-01-22 DIAGNOSIS — M5146 Schmorl's nodes, lumbar region: Secondary | ICD-10-CM | POA: Insufficient documentation

## 2012-01-22 DIAGNOSIS — D739 Disease of spleen, unspecified: Secondary | ICD-10-CM | POA: Insufficient documentation

## 2012-01-22 DIAGNOSIS — C8218 Follicular lymphoma grade II, lymph nodes of multiple sites: Secondary | ICD-10-CM

## 2012-01-22 DIAGNOSIS — C8590 Non-Hodgkin lymphoma, unspecified, unspecified site: Secondary | ICD-10-CM

## 2012-01-22 DIAGNOSIS — K8020 Calculus of gallbladder without cholecystitis without obstruction: Secondary | ICD-10-CM | POA: Insufficient documentation

## 2012-01-22 DIAGNOSIS — K8689 Other specified diseases of pancreas: Secondary | ICD-10-CM | POA: Insufficient documentation

## 2012-01-22 DIAGNOSIS — R221 Localized swelling, mass and lump, neck: Secondary | ICD-10-CM

## 2012-01-22 DIAGNOSIS — Z79899 Other long term (current) drug therapy: Secondary | ICD-10-CM | POA: Insufficient documentation

## 2012-01-22 LAB — CMP (CANCER CENTER ONLY)
ALT(SGPT): 28 U/L (ref 10–47)
BUN, Bld: 9 mg/dL (ref 7–22)
CO2: 31 mEq/L (ref 18–33)
Creat: 0.8 mg/dl (ref 0.6–1.2)
Glucose, Bld: 89 mg/dL (ref 73–118)
Total Bilirubin: 0.6 mg/dl (ref 0.20–1.60)

## 2012-01-22 LAB — CBC WITH DIFFERENTIAL/PLATELET
Basophils Absolute: 0 10*3/uL (ref 0.0–0.1)
Eosinophils Absolute: 0.2 10*3/uL (ref 0.0–0.5)
HGB: 13.4 g/dL (ref 13.0–17.1)
MCV: 86.3 fL (ref 79.3–98.0)
MONO#: 0.5 10*3/uL (ref 0.1–0.9)
MONO%: 12.3 % (ref 0.0–14.0)
NEUT#: 2 10*3/uL (ref 1.5–6.5)
Platelets: 211 10*3/uL (ref 140–400)
RDW: 15 % — ABNORMAL HIGH (ref 11.0–14.6)

## 2012-01-22 LAB — LACTATE DEHYDROGENASE: LDH: 97 U/L (ref 94–250)

## 2012-01-22 MED ORDER — IOHEXOL 300 MG/ML  SOLN
100.0000 mL | Freq: Once | INTRAMUSCULAR | Status: AC | PRN
  Administered 2012-01-22: 100 mL via INTRAVENOUS

## 2012-01-22 NOTE — Telephone Encounter (Signed)
THIS REPORT WAS CALLED AND FAXED. THE REPORT WAS GIVEN TO KRISTIN CURICO,NP. SHE REVIEWED REPORT AND IS SEEING PT. TOMORROW.

## 2012-01-23 ENCOUNTER — Other Ambulatory Visit: Payer: Self-pay | Admitting: Lab

## 2012-01-23 ENCOUNTER — Ambulatory Visit (HOSPITAL_BASED_OUTPATIENT_CLINIC_OR_DEPARTMENT_OTHER): Payer: Medicare Other

## 2012-01-23 ENCOUNTER — Encounter: Payer: Self-pay | Admitting: Oncology

## 2012-01-23 ENCOUNTER — Ambulatory Visit (HOSPITAL_BASED_OUTPATIENT_CLINIC_OR_DEPARTMENT_OTHER): Payer: Medicare Other | Admitting: Oncology

## 2012-01-23 VITALS — BP 120/79 | HR 94 | Temp 97.5°F | Ht 70.0 in | Wt 160.9 lb

## 2012-01-23 VITALS — BP 100/62 | HR 70 | Temp 97.1°F

## 2012-01-23 DIAGNOSIS — R599 Enlarged lymph nodes, unspecified: Secondary | ICD-10-CM

## 2012-01-23 DIAGNOSIS — C8218 Follicular lymphoma grade II, lymph nodes of multiple sites: Secondary | ICD-10-CM

## 2012-01-23 DIAGNOSIS — C8298 Follicular lymphoma, unspecified, lymph nodes of multiple sites: Secondary | ICD-10-CM

## 2012-01-23 DIAGNOSIS — D739 Disease of spleen, unspecified: Secondary | ICD-10-CM

## 2012-01-23 DIAGNOSIS — C8590 Non-Hodgkin lymphoma, unspecified, unspecified site: Secondary | ICD-10-CM

## 2012-01-23 DIAGNOSIS — Z5112 Encounter for antineoplastic immunotherapy: Secondary | ICD-10-CM

## 2012-01-23 MED ORDER — SODIUM CHLORIDE 0.9 % IJ SOLN
10.0000 mL | INTRAMUSCULAR | Status: DC | PRN
  Administered 2012-01-23: 10 mL
  Filled 2012-01-23: qty 10

## 2012-01-23 MED ORDER — LAMIVUDINE 100 MG PO TABS: 100.0000 mg | ORAL_TABLET | Freq: Every day | ORAL | Status: DC

## 2012-01-23 MED ORDER — LIDOCAINE-PRILOCAINE 2.5-2.5 % EX CREA: 1.0000 "application " | TOPICAL_CREAM | CUTANEOUS | Status: DC | PRN

## 2012-01-23 MED ORDER — SODIUM CHLORIDE 0.9 % IV SOLN
Freq: Once | INTRAVENOUS | Status: AC
  Administered 2012-01-23: 10:00:00 via INTRAVENOUS

## 2012-01-23 MED ORDER — ACETAMINOPHEN 325 MG PO TABS
650.0000 mg | ORAL_TABLET | Freq: Once | ORAL | Status: AC
  Administered 2012-01-23: 650 mg via ORAL

## 2012-01-23 MED ORDER — SODIUM CHLORIDE 0.9 % IV SOLN
375.0000 mg/m2 | Freq: Once | INTRAVENOUS | Status: AC
  Administered 2012-01-23: 700 mg via INTRAVENOUS
  Filled 2012-01-23: qty 70

## 2012-01-23 MED ORDER — DIPHENHYDRAMINE HCL 25 MG PO CAPS
50.0000 mg | ORAL_CAPSULE | Freq: Once | ORAL | Status: AC
  Administered 2012-01-23: 50 mg via ORAL

## 2012-01-23 MED ORDER — HEPARIN SOD (PORK) LOCK FLUSH 100 UNIT/ML IV SOLN
500.0000 [IU] | Freq: Once | INTRAVENOUS | Status: AC | PRN
  Administered 2012-01-23: 500 [IU]
  Filled 2012-01-23: qty 5

## 2012-01-23 NOTE — Progress Notes (Signed)
[facility] OFFICE PROGRESS NOTE   CC: Bosie Clos, MD, MD [name], M.D. [name], M.D.   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   CURRENT THERAPY:  started on maintenance q2 month Rituxan on 08/16/2010 with plan for 2 years total.  INTERVAL HISTORY: Edwin Yu 56 y.o. male seen today for follow-up prior to his Rituxan today. The patient has been seen by Dr Pollyann Kennedy for a infected left salivary gland. Per the patient, this area was drained. Biopsy performed and was negative for malignancy. Area improved, but still swollen. No pain. Patient has not followed up with Dr Pollyann Kennedy yet. He denies fevers, chills, or night sweats. He has no other palpable adenopathy anywhere else.  He denies head ache, mucositis, nasal congestion, cough, hemoptysis, diarrhea, abdominal pain, abdominal swelling, hematuria, dysuria, pyuria, skin rash.  He has chronic back pain and takes pain med with pain service.   MEDICAL HISTORY: Past Medical History  Diagnosis Date  . 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  [name] Chronic lower back pain   . Hepatitis B antibody positive 2011    on Lamivudine until finish of maintenance Rituxan in 08/2012.  [name] Cancer     nhl   ALLERGIES:   has no known allergies.  MEDICATIONS:  Current Outpatient Prescriptions  Medication Sig Dispense Refill  . buPROPion (WELLBUTRIN XL) 150 MG 24 hr tablet Take 150 mg by mouth Three times a day.      . cyclobenzaprine (FLEXERIL) 10 MG tablet Take 10 mg by mouth every 12  (twelve) hours.        . diphenhydramine-acetaminophen (TYLENOL PM) 25-500 MG TABS Take 1 tablet by mouth at bedtime as needed.        . lamivudine (EPIVIR) 100 MG tablet Take 1 tablet (100 mg total) by mouth daily.  30 tablet  3  . lidocaine-prilocaine (EMLA) cream Apply 1 application topically as needed. Apply to porta cath site one hour prior to needle access  30 g  1  . morphine (MS CONTIN) 100 MG 12 hr tablet Take 100 mg by mouth 3 (three) times daily.        [name] oxyCODONE (OXY IR/ROXICODONE) 5 MG immediate release tablet Take 5 mg by mouth every 8 (eight) hours as needed.         No current facility-administered medications for this visit.   Facility-Administered Medications Ordered in Other Visits  Medication Dose Route Frequency Provider Last Rate Last Dose  . 0.9 %  sodium chloride infusion   Intravenous Once Exie Parody, MD 20 mL/hr at 01/23/12 1024    . acetaminophen (TYLENOL) tablet 650 mg  650 mg Oral Once Exie Parody, MD   650 mg at 01/23/12 1025  . diphenhydrAMINE (BENADRYL) capsule 50 mg  50 mg Oral Once Exie Parody, MD   50 mg at 01/23/12 1025  . heparin lock flush 100 unit/mL  500 Units Intracatheter Once PRN Exie Parody, MD      . riTUXimab (RITUXAN) 700 mg in sodium chloride 0.9 %  250 mL chemo infusion  375 mg/m2 (Treatment Plan Actual) Intravenous Once Exie Parody, MD 136 mL/hr at 01/23/12 1205 700 mg at 01/23/12 1205  . sodium chloride 0.9 % injection 10 mL  10 mL Intracatheter PRN Exie Parody, MD        SURGICAL HISTORY: No past surgical history on file.  REVIEW OF SYSTEMS:  Pertinent items are noted in HPI.   PHYSICAL EXAMINATION: ECOG PERFORMANCE STATUS: 0-1  Filed Vitals:   01/23/12 0929  BP: 120/79  Pulse: 94  Temp: 97.5 F (36.4 C)    General:  Thin-appearing man in no acute distress.  Eyes:  no scleral icterus.  ENT:  There were no oropharyngeal lesions.  Neck was without thyromegaly.  Lymphatics:  Negative cervical, supraclavicular or axillary adenopathy.  He has  significant swelling of left submandibular salivary gland externally about 5cm that was tender to palpation.  His left salivary gland seemed to enlarged and tender on palpation at the base of the mandible.    There was no skin breakdown, erythema, or purulent discharge.  Respiratory: lungs were clear bilaterally without wheezing or crackles.  Cardiovascular:  Regular rate and rhythm, S1/S2, without murmur, rub or gallop.  There was no pedal edema.  GI:  abdomen was soft, flat, nontender, nondistended, without organomegaly.  I again was able to feel a 4x2 cm left lower quadrant mobile mass that was tender to palpation.  Muscoloskeletal:  no spinal tenderness of palpation of vertebral spine.  Skin exam was without echymosis, petichae.  Neuro exam was nonfocal.  Patient was able to get on and off exam table without assistance.  Gait was normal.  Patient was alerted and oriented.  Attention was good.   Language was appropriate.  Mood was normal without depression.  Speech was not pressured.  Thought content was not tangential.    LABORATORY DATA: Results for orders placed in visit on 01/22/12 (from the past 48 hour(s))  CBC WITH DIFFERENTIAL     Status: Abnormal   Collection Time   01/22/12  8:39 AM      Component Value Range Comment   WBC 3.8 (*) 4.0 - 10.3 (10e3/uL)    NEUT# 2.0  1.5 - 6.5 (10e3/uL)    HGB 13.4  13.0 - 17.1 (g/dL)    HCT 16.1  09.6 - 04.5 (%)    Platelets 211  140 - 400 (10e3/uL)    MCV 86.3  79.3 - 98.0 (fL)    MCH 29.0  27.2 - 33.4 (pg)    MCHC 33.6  32.0 - 36.0 (g/dL)    RBC 4.09  8.11 - 9.14 (10e6/uL)    RDW 15.0 (*) 11.0 - 14.6 (%)    lymph# 1.1  0.9 - 3.3 (10e3/uL)    MONO# 0.5  0.1 - 0.9 (10e3/uL)    Eosinophils Absolute 0.2  0.0 - 0.5 (10e3/uL)    Basophils Absolute 0.0  0.0 - 0.1 (10e3/uL)    NEUT% 53.7  39.0 - 75.0 (%)    LYMPH% 28.2  14.0 - 49.0 (%)    MONO% 12.3  0.0 - 14.0 (%)    EOS% 4.8  0.0 - 7.0 (%)    BASO% 1.0  0.0 - 2.0 (%)    nRBC 0  0 - 0 (%)   LACTATE  DEHYDROGENASE     Status: Normal   Collection Time   01/22/12  8:39 AM      Component Value Range Comment   LDH 97  94 - 250 (U/L)   COMPREHENSIVE METABOLIC PANEL     Status: Normal   Collection Time   01/22/12  8:39 AM      Component Value Range Comment   Sodium 142  128 - 145 (mEq/L)    Potassium 4.0  3.3 - 4.7 (mEq/L)    Chloride 98  98 - 108 (mEq/L)    CO2 31  18 - 33 (mEq/L)    Glucose, Bld 89  73 - 118 (mg/dL)    BUN, Bld 9  7 - 22 (mg/dL)    Creat 0.8  0.6 - 1.2 (mg/dl)    Total Bilirubin 8.11  0.20 - 1.60 (mg/dl)    Alkaline Phosphatase 64  26 - 84 (U/L)    AST 20  11 - 38 (U/L)    ALT(SGPT) 28  10 - 47 (U/L)    Total Protein 6.5  6.4 - 8.1 (g/dL)    Albumin 3.5  3.3 - 5.5 (g/dL)    Calcium 9.0  8.0 - 10.3 (mg/dL)     RADIOLOGY: *RADIOLOGY REPORT*  Clinical Data: Non-Hodgkins lymphoma diagnosed in 2011.  Chemotherapy in progress. Upper abdominal pain.  CT CHEST, ABDOMEN AND PELVIS WITH CONTRAST  Technique: Contiguous axial images of the chest abdomen and pelvis  were obtained after IV contrast administration.  Contrast: 100 ml Omnipaque-300  Comparison: 07/12/2011  CT CHEST  Findings: Lung windows demonstrate probable secretions within the  dependent trachea, including on image 12. Mild right greater than  left bibasilar scarring.  Soft tissue windows demonstrate a right-sided Port-A-Cath which  terminates at the high right atrium. No supraclavicular adenopathy.  Right axillary nodal clips. No axillary adenopathy. Tortuous  descending thoracic aorta. Normal heart size without pericardial or  pleural effusion. No central pulmonary embolism, on this non-  dedicated study. No mediastinal or hilar adenopathy.  IMPRESSION:  No acute process or evidence of active lymphoma within the chest.  CT ABDOMEN AND PELVIS  Findings: A focus of hyperenhancement in the high right lobe of  the liver measures 1.0 cm on image 50 and is similar to image 53 of  the prior. Likely a  hemangioma or perfusion anomaly. Too small to  characterize hepatic dome lesion image 48. Unchanged 5 mm low  density left hepatic lobe lesion on image 58.  New low density splenic lesions. These measure less than or equal  to 1 cm. Largest on image 54.  Normal stomach. Mild pancreatic atrophy. The proximal transverse  duodenum is mildly prominent on image 78. No cause is seen; the  stomach is normal in caliber.  Moderate intrahepatic biliary ductal dilatation is again  identified. Greater on the left than right. Left central hepatic  duct measures 1.0 cm on image 57 and is unchanged. The common duct  remains dilated, measuring 1.4 cm on image 66 versus 1.2 cm at the  same level on the prior. No focal transition is identified. No  calcified common duct stone.  Gallstones. Probable concurrent sludge, with increased density  within. No evidence of acute cholecystitis.  Normal adrenal glands and right kidney. Mild scarring of the upper  pole left kidney.  No retroperitoneal or retrocrural adenopathy.  Colonic stool burden suggests constipation.  Normal terminal ileum and appendix. Normal small bowel without  abdominal ascites.  No pelvic adenopathy. Normal urinary bladder and prostate. No  significant free fluid. Degenerative changes of the left  glenohumeral joint. Multiple Schmorl's node deformities, including  at the superior endplate of L2.  IMPRESSION:  1. Development of numerous small low density splenic lesions.  Given the history of ongoing chemotherapy for lymphoma, findings  are suspicious for fungal or atypical bacterial infection.  Correlate with infectious symptoms. Splenic lymphoma felt less  likely. These results will be called to the ordering clinician or  representative by the Radiologist Assistant, and communication  documented in the PACS Dashboard.  2. Otherwise, no evidence of lymphoma within the abdomen or  pelvis.  3. Cholelithiasis with similar moderate  intrahepatic and  progressive moderate extrahepatic biliary ductal dilatation.  Consider ERCP or MRCP to exclude distal common duct stone or  otherwise occult ampullary lesion.  4. Possible constipation.  Original Report Authenticated By: Consuello Bossier, M.D.  ASSESSMENT AND PLAN:  1. Left salivary gland swelling and tenderness: Recently drained by Dr Pollyann Kennedy. Biopsy was negative for malignancy. Recommend that he follow-up with ENT for further management of this. 2. History of lymphoma: He would like to proceed with Rituxan today.  He has not had any side effect from maintenance Rituxan. No evidence of recurrent lymphoma on clinical exam or CT scans. 3. Splenic lesions: Possible fungal or atypical bacterial infection. The patient has received many antibiotics in the past 2-3 months for his left neck tenderness/swelling. Recommend referral to ID.  4. Family history of colon cancer in his father with a palpable LLQ abdominal mass.  Colonoscopy performed within the past month and was normal per his report. 5. Chronic low-back pain from osteoarthritis.  He is on cyclobenzaprine, morphine sulfate and oxycodone per PCP.  6. History of hepatitis B.  He is on lamivudine for the duration of Rituxan therapy. I have refilled this today.  7. Smoking:  I advised him of the risk of smoking in causing cancer, and vascular complications.  On Wellbutrin per PCP and reports that he is slowly cutting back on the number of cigarettes he is smoking. 8. Follow-up: In 2 months   The length of time of the face-to-face encounter was 25 minutes. More than 50% of time was spent counseling and coordination of care.

## 2012-01-23 NOTE — Patient Instructions (Signed)
[facility] Discharge Instructions for Patients Receiving Chemotherapy  Today you received the following chemotherapy agent Rituxan.  To help prevent nausea and vomiting after your treatment, we encourage you to take your nausea medication. Begin taking it as often as prescribed for by Dr. Gaylyn Rong.    If you develop nausea and vomiting that is not controlled by your nausea medication, call the clinic [phone]. If it is after clinic hours your family physician or the after hours number for the clinic or go to the Emergency Department.   BELOW ARE SYMPTOMS THAT SHOULD BE REPORTED IMMEDIATELY:  *FEVER GREATER THAN 100.5 F  *CHILLS WITH OR WITHOUT FEVER  NAUSEA AND VOMITING THAT IS NOT CONTROLLED WITH YOUR NAUSEA MEDICATION  *UNUSUAL SHORTNESS OF BREATH  *UNUSUAL BRUISING OR BLEEDING  TENDERNESS IN MOUTH AND THROAT WITH OR WITHOUT PRESENCE OF ULCERS  *URINARY PROBLEMS  *BOWEL PROBLEMS  UNUSUAL RASH Items with * indicate a potential emergency and should be followed up as soon as possible.  One of the nurses will contact you 24 hours after your treatment. Please let the nurse know about any problems that you may have experienced. Feel free to call the clinic you have any questions or concerns. The clinic phone number is [phone].   I have been informed and understand all the instructions given to me. I know to contact the clinic, my physician, or go to the Emergency Department if any problems should occur. I do not have any questions at this time, but understand that I may call the clinic during office hours   should I have any questions or need assistance in obtaining follow up care.    __________________________________________  _____________  __________ Signature of Patient or Authorized Representative            Date                   Time    __________________________________________ Nurse's Signature

## 2012-01-25 ENCOUNTER — Telehealth: Payer: Self-pay | Admitting: Oncology

## 2012-01-25 NOTE — Telephone Encounter (Signed)
called ID lmovm to scheduled pt for appt and to rtn call to me with appt d/t

## 2012-01-25 NOTE — Telephone Encounter (Signed)
received a call from [facility] @ ID pt is scheduled for appt with Dr. Orvan Falconer on 06/05 @ 4pm. pt is aware of appt

## 2012-02-06 ENCOUNTER — Encounter: Payer: Self-pay | Admitting: Internal Medicine

## 2012-02-06 ENCOUNTER — Ambulatory Visit (INDEPENDENT_AMBULATORY_CARE_PROVIDER_SITE_OTHER): Payer: Medicare Other | Admitting: Internal Medicine

## 2012-02-06 VITALS — BP 134/83 | HR 79 | Temp 98.3°F | Ht 70.0 in | Wt 164.5 lb

## 2012-02-06 DIAGNOSIS — D739 Disease of spleen, unspecified: Secondary | ICD-10-CM

## 2012-02-06 DIAGNOSIS — F17200 Nicotine dependence, unspecified, uncomplicated: Secondary | ICD-10-CM

## 2012-02-06 DIAGNOSIS — F17210 Nicotine dependence, cigarettes, uncomplicated: Secondary | ICD-10-CM | POA: Insufficient documentation

## 2012-02-06 DIAGNOSIS — M810 Age-related osteoporosis without current pathological fracture: Secondary | ICD-10-CM | POA: Insufficient documentation

## 2012-02-06 DIAGNOSIS — M4850XA Collapsed vertebra, not elsewhere classified, site unspecified, initial encounter for fracture: Secondary | ICD-10-CM

## 2012-02-06 DIAGNOSIS — R1904 Left lower quadrant abdominal swelling, mass and lump: Secondary | ICD-10-CM

## 2012-02-06 DIAGNOSIS — IMO0002 Reserved for concepts with insufficient information to code with codable children: Secondary | ICD-10-CM

## 2012-02-06 NOTE — Progress Notes (Signed)
Patient ID: Edwin Yu, male   DOB: 1955-09-16, 56 y.o.   MRN: [id number]     [facility] for Infectious Disease  Patient Active Problem List  Diagnoses  . Chronic lower back pain  . Follicular lymphoma grade II of lymph nodes of multiple sites  . Hepatitis B infection  . Lymphoma  . Salivary gland infection  . Cigarette smoker  . Osteoporosis  . Vertebral compression fracture  . Splenic lesion  . LLQ abdominal mass    Patient's Medications  New Prescriptions   No medications on file  Previous Medications   BUPROPION (WELLBUTRIN XL) 150 MG 24 HR TABLET    Take 150 mg by mouth Three times a day.   CYCLOBENZAPRINE (FLEXERIL) 10 MG TABLET    Take 10 mg by mouth every 12 (twelve) hours.     DIPHENHYDRAMINE-ACETAMINOPHEN (TYLENOL PM) 25-500 MG TABS    Take 1 tablet by mouth at bedtime as needed.     LAMIVUDINE (EPIVIR) 100 MG TABLET    Take 1 tablet (100 mg total) by mouth daily.   LIDOCAINE-PRILOCAINE (EMLA) CREAM    Apply 1 application topically as needed. Apply to porta cath site one hour prior to needle access   MORPHINE (MS CONTIN) 100 MG 12 HR TABLET    Take 100 mg by mouth 3 (three) times daily.     OXYCODONE (OXY IR/ROXICODONE) 5 MG IMMEDIATE RELEASE TABLET    Take 5 mg by mouth every 8 (eight) hours as needed.     RITUXIMAB (RITUXAN) 10 MG/ML INJECTION    Inject into the vein.  Modified Medications   No medications on file  Discontinued Medications   No medications on file    Subjective: Mr. Edwin Yu is a 56 year old who is currently completing therapy for non-Hodgkin's lymphoma who is referred to me by Dr. Jethro Bolus for evaluation of new splenic lesions seen on a CAT scan done May 21. He is still getting monthly infusions of Rituxan to complete his lymphoma therapy and records available to me indicate that he has had a good response. However he has been struggling with some left submandibular salivary gland infection over the last 3 months. He states that he started  to notice swelling there 3 months ago and eventually got to be about the size of "an egg". He also had about 10 days of fever and night sweats. He received a course of Augmentin and his fever and sweats resolved but the swelling did not go down. He recalls getting an antibiotic injection by his primary care physician, Dr. Montey Yu. He was then referred to Dr. Allegra Yu who did incision and drainage about 3 weeks ago and placed him on clindamycin. He completed 10 days of clindamycin about 10 days ago. The swelling has greatly improved and he has stopped having any drainage from the incision site. He has had no further fevers or sweats.  While in the midst of all of this he underwent a followup CAT scan of his chest and abdomen. Several very small lesions were noted incidentally in his spleen that had not been seen on a previous scan last November. The largest lesion was 9.7 mm in diameter. The radiologist mentioned the possibility of fungal or atypical bacterial infection.   He states that his appetite has not been very good since he was first diagnosed with lymphoma several years ago. He lost about 20 pounds unintentionally when he was first on chemotherapy but records indicate that his weight has been  fairly stable over the last 8 months.  He has chronic back pain which he relates to his osteoporosis, arthritis and vertebral compression fractures. He is followed at a local pain Center and is currently undergoing evaluation by an endocrinologist, Dr. Izell Fairbanks, in [facility]. Otherwise he states that he is doing relatively well and he was not quite sure why he was referred to see me. He has not been having any other pains, mouth sores, cough, shortness of breath, abdominal pain, nausea, vomiting, diarrhea or dysuria. He had noted a left lower quadrant mass recently. Records indicate that a colonoscopy was normal.  Objective: Temp: 98.3 F (36.8 C) (06/05 1543) Temp src: Oral (06/05 1543) BP:  134/83 mmHg (06/05 1547) Pulse Rate: 79  (06/05 1547)  General: He is in no distress Skin: He has no rash. His right upper chest Port-A-Cath site is normal Oral: He is edentulous with full upper and lower dentures. His mouth and throat are clear Neck: He has some firm subcutaneous tissue and his left submandibular region with a healing incision. There is still one open area of the incision but there is no drainage on his Band-Aid and no expressible fluid. It is not tender to touch and there is no fluctuance. Lungs: Clear Cor: Regular S1 and S2 no murmurs Abdomen: Soft and nontender. I can feel a smooth, nontender liver edge on deep inspiration at the costal margin. I do not palpate a spleen. I do not palpate any left lower quadrant mass and he agrees that he can no longer feel it himself.  Assessment: I do not know the cause of his tiny splenic lesions. He is currently asymptomatic without any evidence of active infection clinically. It is possible that his recent fever or and sweats could have been do to a blood stream infection that could have seeded his spleen. Given that he is still on Rituxan therapy he remains relatively immunosuppressed. At this point I favor continued observation off of antibiotics with close followup. If he develops any further fever, sweats or other signs of infection I will obtain blood cultures and consider fungal serologies. I also requested blood work that he says has been done by Drs. Rice and Patel.  He has a history of prior hepatitis B infection and serologies in 2011 showed that he was positive for hepatitis B core total antibody and hepatitis B surface antibody suggesting resolved infection. He is currently on lamivudine therapy to prevent reactivation while he is still undergoing chemotherapy.  Plan: 1. Continue observation off of antibiotics 2. Requested review outside records 3. Followup in 3 weeks sooner if he becomes symptomatic   Cliffton Asters,  MD [facility] for Infectious Disease [facility] [phone] pager   [phone] cell 02/06/2012, 4:55 PM

## 2012-02-27 ENCOUNTER — Encounter: Payer: Self-pay | Admitting: Internal Medicine

## 2012-02-27 ENCOUNTER — Ambulatory Visit (INDEPENDENT_AMBULATORY_CARE_PROVIDER_SITE_OTHER): Payer: Medicare Other | Admitting: Internal Medicine

## 2012-02-27 VITALS — BP 114/78 | HR 83 | Temp 98.1°F | Ht 70.0 in | Wt 160.0 lb

## 2012-02-27 DIAGNOSIS — D739 Disease of spleen, unspecified: Secondary | ICD-10-CM

## 2012-02-27 DIAGNOSIS — R5383 Other fatigue: Secondary | ICD-10-CM

## 2012-02-27 LAB — CBC WITH DIFFERENTIAL/PLATELET
Eosinophils Relative: 3 % (ref 0–5)
HCT: 42.4 % (ref 39.0–52.0)
Hemoglobin: 14.4 g/dL (ref 13.0–17.0)
Lymphocytes Relative: 19 % (ref 12–46)
Lymphs Abs: 0.9 10*3/uL (ref 0.7–4.0)
MCV: 84.1 fL (ref 78.0–100.0)
Monocytes Absolute: 0.3 10*3/uL (ref 0.1–1.0)
Monocytes Relative: 6 % (ref 3–12)
RBC: 5.04 MIL/uL (ref 4.22–5.81)
WBC: 4.8 10*3/uL (ref 4.0–10.5)

## 2012-02-27 NOTE — Progress Notes (Signed)
Patient ID: Edwin Yu, male   DOB: February 22, 1956, 56 y.o.   MRN: [id number]    [facility] for Infectious Disease  Patient Active Problem List  Diagnosis  . Chronic lower back pain  . Follicular lymphoma grade II of lymph nodes of multiple sites  . Hepatitis B infection  . Lymphoma  . Salivary gland infection  . Cigarette smoker  . Osteoporosis  . Vertebral compression fracture  . Splenic lesion  . LLQ abdominal mass  . Fatigue    Patient's Medications  New Prescriptions   No medications on file  Previous Medications   BUPROPION (WELLBUTRIN XL) 150 MG 24 HR TABLET    Take 150 mg by mouth Three times a day.   CYCLOBENZAPRINE (FLEXERIL) 10 MG TABLET    Take 10 mg by mouth every 12 (twelve) hours.     DIPHENHYDRAMINE-ACETAMINOPHEN (TYLENOL PM) 25-500 MG TABS    Take 1 tablet by mouth at bedtime as needed.     LAMIVUDINE (EPIVIR) 100 MG TABLET    Take 1 tablet (100 mg total) by mouth daily.   LIDOCAINE-PRILOCAINE (EMLA) CREAM    Apply 1 application topically as needed. Apply to porta cath site one hour prior to needle access   MORPHINE (MS CONTIN) 100 MG 12 HR TABLET    Take 100 mg by mouth 3 (three) times daily.     OXYCODONE (OXY IR/ROXICODONE) 5 MG IMMEDIATE RELEASE TABLET    Take 5 mg by mouth every 8 (eight) hours as needed.     RITUXIMAB (RITUXAN) 10 MG/ML INJECTION    Inject into the vein.  Modified Medications   No medications on file  Discontinued Medications   No medications on file    Subjective: Edwin Yu is in for his routine followup visit. He continues to be bothered by his chronic back pain which is unchanged. Over the past few days he has had excessive fatigue and has been sleeping much more than usual. He is also had some nausea without vomiting. He has not had any fever, chills, or sweats. He has not noted any change in the swelling in the submandibular region. He has not changed the dose of his narcotic pain medicines are Tylenol PM. He is on no new  medications. He has been off of all antibiotics since May 26. He states that his brain MRI scan was normal. He denies feeling depressed.  Objective: Temp: 98.1 F (36.7 C) (06/26 1437) Temp src: Oral (06/26 1437) BP: 114/78 mmHg (06/26 1437) Pulse Rate: 83  (06/26 1437)  General: His weight is fairly stable over the past year at 160 pounds Skin: No rash Oral: Dentures. Oropharynx is clear Eyes: External exam normal Neck: Has a scarred submandibular node without fluctuance or cellulitis. There is no drainage. Lungs: Clear Cor: Regular S1 and S2 no murmurs Chest: Right upper chest Port-A-Cath site is normal Abdomen: Soft and nontender Joints extremities: Normal  Assessment: I do not have an explanation for his recent fatigue or nausea. It is also difficult to tie together his recent submandibular infection and the lesions seen on his splenic scan recently. I think it is best that we repeat basic blood work today and blood cultures now that he is off antibiotics to make certain that he does not have a blood stream infection related to his Port-A-Cath that could have led to splenic emboli.  Plan: 1. Continue observation off of antibiotics 2. Check CBC with differential, complete metabolic panel and blood cultures   Jonny Ruiz  Orvan Falconer, MD [facility] for Infectious Disease [facility] [phone] pager   [phone] cell 02/27/2012, 3:05 PM

## 2012-02-28 LAB — COMPREHENSIVE METABOLIC PANEL
Albumin: 4.7 g/dL (ref 3.5–5.2)
BUN: 10 mg/dL (ref 6–23)
CO2: 28 mEq/L (ref 19–32)
Calcium: 9.6 mg/dL (ref 8.4–10.5)
Chloride: 104 mEq/L (ref 96–112)
Creat: 0.79 mg/dL (ref 0.50–1.35)
Potassium: 3.8 mEq/L (ref 3.5–5.3)

## 2012-03-05 LAB — CULTURE, BLOOD (SINGLE): Organism ID, Bacteria: NO GROWTH

## 2012-03-19 ENCOUNTER — Other Ambulatory Visit: Payer: Self-pay | Admitting: Oncology

## 2012-03-21 ENCOUNTER — Other Ambulatory Visit: Payer: Self-pay | Admitting: Oncology

## 2012-03-21 DIAGNOSIS — C8218 Follicular lymphoma grade II, lymph nodes of multiple sites: Secondary | ICD-10-CM

## 2012-03-24 ENCOUNTER — Ambulatory Visit (HOSPITAL_BASED_OUTPATIENT_CLINIC_OR_DEPARTMENT_OTHER): Payer: Medicare Other | Admitting: Oncology

## 2012-03-24 ENCOUNTER — Telehealth: Payer: Self-pay | Admitting: *Deleted

## 2012-03-24 ENCOUNTER — Other Ambulatory Visit (HOSPITAL_BASED_OUTPATIENT_CLINIC_OR_DEPARTMENT_OTHER): Payer: Medicare Other | Admitting: Lab

## 2012-03-24 ENCOUNTER — Ambulatory Visit (HOSPITAL_BASED_OUTPATIENT_CLINIC_OR_DEPARTMENT_OTHER): Payer: Medicare Other

## 2012-03-24 VITALS — BP 126/76 | HR 91 | Temp 97.6°F | Ht 70.0 in | Wt 165.8 lb

## 2012-03-24 VITALS — BP 113/73 | HR 79 | Temp 98.2°F

## 2012-03-24 DIAGNOSIS — F17210 Nicotine dependence, cigarettes, uncomplicated: Secondary | ICD-10-CM

## 2012-03-24 DIAGNOSIS — C8298 Follicular lymphoma, unspecified, lymph nodes of multiple sites: Secondary | ICD-10-CM

## 2012-03-24 DIAGNOSIS — R609 Edema, unspecified: Secondary | ICD-10-CM

## 2012-03-24 DIAGNOSIS — M549 Dorsalgia, unspecified: Secondary | ICD-10-CM

## 2012-03-24 DIAGNOSIS — Z800 Family history of malignant neoplasm of digestive organs: Secondary | ICD-10-CM

## 2012-03-24 DIAGNOSIS — C8218 Follicular lymphoma grade II, lymph nodes of multiple sites: Secondary | ICD-10-CM

## 2012-03-24 DIAGNOSIS — Z5112 Encounter for antineoplastic immunotherapy: Secondary | ICD-10-CM

## 2012-03-24 DIAGNOSIS — M545 Low back pain: Secondary | ICD-10-CM

## 2012-03-24 LAB — COMPREHENSIVE METABOLIC PANEL
ALT: 10 U/L (ref 0–53)
AST: 11 U/L (ref 0–37)
Albumin: 4.3 g/dL (ref 3.5–5.2)
Alkaline Phosphatase: 53 U/L (ref 39–117)
Glucose, Bld: 104 mg/dL — ABNORMAL HIGH (ref 70–99)
Potassium: 3.7 mEq/L (ref 3.5–5.3)
Sodium: 142 mEq/L (ref 135–145)
Total Bilirubin: 0.5 mg/dL (ref 0.3–1.2)
Total Protein: 6.1 g/dL (ref 6.0–8.3)

## 2012-03-24 LAB — CBC WITH DIFFERENTIAL/PLATELET
BASO%: 0.4 % (ref 0.0–2.0)
Eosinophils Absolute: 0.2 10*3/uL (ref 0.0–0.5)
LYMPH%: 16.5 % (ref 14.0–49.0)
MCHC: 34.1 g/dL (ref 32.0–36.0)
MCV: 85.2 fL (ref 79.3–98.0)
MONO%: 9.9 % (ref 0.0–14.0)
NEUT#: 4 10*3/uL (ref 1.5–6.5)
RBC: 4.99 10*6/uL (ref 4.20–5.82)
RDW: 14 % (ref 11.0–14.6)
WBC: 5.6 10*3/uL (ref 4.0–10.3)

## 2012-03-24 MED ORDER — HEPARIN SOD (PORK) LOCK FLUSH 100 UNIT/ML IV SOLN
500.0000 [IU] | Freq: Once | INTRAVENOUS | Status: AC | PRN
  Administered 2012-03-24: 500 [IU]
  Filled 2012-03-24: qty 5

## 2012-03-24 MED ORDER — SODIUM CHLORIDE 0.9 % IJ SOLN
10.0000 mL | INTRAMUSCULAR | Status: DC | PRN
  Administered 2012-03-24: 10 mL
  Filled 2012-03-24: qty 10

## 2012-03-24 MED ORDER — DIPHENHYDRAMINE HCL 25 MG PO CAPS
50.0000 mg | ORAL_CAPSULE | Freq: Once | ORAL | Status: AC
  Administered 2012-03-24: 50 mg via ORAL

## 2012-03-24 MED ORDER — SODIUM CHLORIDE 0.9 % IV SOLN
375.0000 mg/m2 | Freq: Once | INTRAVENOUS | Status: AC
  Administered 2012-03-24: 700 mg via INTRAVENOUS
  Filled 2012-03-24: qty 70

## 2012-03-24 MED ORDER — ACETAMINOPHEN 325 MG PO TABS
650.0000 mg | ORAL_TABLET | Freq: Once | ORAL | Status: AC
  Administered 2012-03-24: 650 mg via ORAL

## 2012-03-24 MED ORDER — SODIUM CHLORIDE 0.9 % IV SOLN
Freq: Once | INTRAVENOUS | Status: AC
  Administered 2012-03-24: 11:00:00 via INTRAVENOUS

## 2012-03-24 NOTE — Telephone Encounter (Signed)
Gave patient appointment for 05-26-2012 starting at 8:15am with labs followed by midlevel sent michelle email to set up patients treatment for 05-26-2012 stated to the patient that the treatment will be addon by [facility]

## 2012-03-24 NOTE — Progress Notes (Signed)
[facility]  Telephone:(336) [phone] [phone]   OFFICE PROGRESS NOTE   Cc:  Bosie Clos, MD [name], M.D.    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   CURRENT THERAPY: started on maintenance q2 month Rituxan on 08/16/2010 with plan for 2 years total.  INTERVAL HISTORY: Edwin Yu 56 y.o. male returns for regular follow up by himself.  He reports feeling well. His left submandibular gland has slightly decreased in size. There was a drainage was than a few months ago with [place] trying up. He does not palpate any little swelling well. He does have a stable left lower quadrant abdominal mass that was negative on previous CT scan and was negative on colonoscopy. This abdominal mass does not cause any pain, change in bowel bladder habit, or increased in size compared to before. He still smokes cigarettes however has cut down certainly 3 he smokes now about 5 cigarettes a day. He is on Wellbutrin in addition to nicotine patch and gum. He has chronic back pain and has been pain medication with pain clinic. The back pain has not worsens or has radiate. He denies any bowel bladder incontinence or neuropathy.  Patient denies fever, anorexia, weight loss, fatigue, headache, visual changes, confusion, drenching night sweats, palpable lymph node swelling, mucositis, odynophagia, dysphagia, nausea vomiting, jaundice, chest pain, palpitation, shortness of breath, dyspnea on exertion, productive cough, gum bleeding, epistaxis, hematemesis, hemoptysis, abdominal pain, abdominal swelling, early satiety, melena, hematochezia, hematuria, skin rash, spontaneous bleeding, joint swelling, joint  pain, heat or cold intolerance, bowel bladder incontinence, focal motor weakness, paresthesia, depression, suicidal or homocidal ideation, feeling hopelessness.   Past Medical History  Diagnosis Date  . 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  [name] Chronic lower back pain   . Hepatitis B antibody positive 2011    on Lamivudine until finish of maintenance Rituxan in 08/2012.  [name] Cancer     nhl    No past surgical history on file.  Current Outpatient Prescriptions  Medication Sig Dispense Refill  . buPROPion (WELLBUTRIN XL) 150 MG 24 hr tablet Take 150 mg by mouth daily.       . cyclobenzaprine (FLEXERIL) 10 MG tablet Take 10 mg by mouth every 12 (twelve) hours.        . diphenhydramine-acetaminophen (TYLENOL PM) 25-500 MG TABS Take 1 tablet by mouth at bedtime as needed.        . lamivudine (EPIVIR) 100 MG tablet Take 1 tablet (100 mg total) by mouth daily.  30 tablet  3  . lidocaine-prilocaine (EMLA) cream Apply 1 application topically as needed. Apply to porta cath site one hour prior to needle access  30 g  1  . morphine (MS CONTIN) 100 MG 12 hr tablet Take 100 mg by mouth 3 (three) times daily.        [name] oxyCODONE (OXY IR/ROXICODONE) 5 MG immediate release tablet Take 5 mg by mouth 4 (four) times daily.       . riTUXimab (RITUXAN) 10 MG/ML injection Inject into the vein.  ALLERGIES:   has no known allergies.  REVIEW OF SYSTEMS:  The rest of the 14-point review of system was negative.   Filed Vitals:   03/24/12 0918  BP: 126/76  Pulse: 91  Temp: 97.6 F (36.4 C)   Wt Readings from Last 3 Encounters:  03/24/12 165 lb 12.8 oz (75.206 kg)  02/27/12 160 lb (72.576 kg)  02/06/12 164 lb 8 oz (74.617 kg)   ECOG Performance status: 1 due to chronic back pain  PHYSICAL EXAMINATION:   General: Thin-appearing man in no  acute distress. Eyes: no scleral icterus. ENT: There were no oropharyngeal lesions. Neck was without thyromegaly. Lymphatics: Negative cervical, supraclavicular or axillary adenopathy. He has minimal swelling of submandibular salivary gland with a dried scab. Respiratory: lungs were clear bilaterally without wheezing or crackles. Cardiovascular: Regular rate and rhythm, S1/S2, without murmur, rub or gallop. There was no pedal edema. GI: abdomen was soft, flat, nontender, nondistended, without organomegaly. I again was able to feel a 4x2 cm left lower quadrant mobile mass that wasnontender to palpation. Muscoloskeletal: no spinal tenderness of palpation of vertebral spine. Skin exam was without echymosis, petichae. Neuro exam was nonfocal. Patient was able to get on and off exam table without assistance. Gait was normal. Patient was alerted and oriented. Attention was good. Language was appropriate. Mood was normal without depression. Speech was not pressured. Thought content was not tangential.    LABORATORY/RADIOLOGY DATA:  Lab Results  Component Value Date   WBC 5.6 03/24/2012   HGB 14.5 03/24/2012   HCT 42.5 03/24/2012   PLT 206 03/24/2012   GLUCOSE 68* 02/27/2012   ALKPHOS 63 02/27/2012   ALT 11 02/27/2012   AST 13 02/27/2012   NA 142 02/27/2012   K 3.8 02/27/2012   CL 104 02/27/2012   CREATININE 0.79 02/27/2012   BUN 10 02/27/2012   CO2 28 02/27/2012   INR 0.89 07/06/2010    ASSESSMENT AND PLAN:   1. Left salivary gland swelling and tenderness: improved.  This is most consistent with a retained salivary stone.  2. History of lymphoma: He would like to proceed with Rituxan today. He has not had any side effect from maintenance Rituxan. The last surveillance CT scan in May 2013 was negative.  His next CT is due in November 2013 which needs to be ordered next visit.  3. Family history of colon cancer in his father with a palpable LLQ abdominal mass. He saw Dr. Elnoria Howard and had a negative colonoscopy in  2013.  4. Chronic low-back pain from osteoarthritis. He is on cyclobenzaprine, morphine sulfate and oxycodone per pain clinic.  5. History of hepatitis B. He is on lamivudine for the duration of Rituxan therapy.  6. Smoking: he now smokes 5 cigarettes a day. He is on bupropion and nicotine patch and gum. I advised him to him to go off cigarettes at the end of this year. 7. Followup: In about 2 months with same day Rituxan infusion.

## 2012-03-24 NOTE — Patient Instructions (Signed)
1.  Non Hodgkin's lymphoma:  Stable.  Continue with the 9th dose of every 2 month Rituxan.  There are 3 left.  2.  Follow up in about 2 months.  Next surveillance CT scan will be around November 2013.

## 2012-03-24 NOTE — Telephone Encounter (Signed)
Per staff message and POF I have scheduled appt.  JMW  

## 2012-03-24 NOTE — Patient Instructions (Signed)
[facility] Discharge Instructions for Patients Receiving Chemotherapy  Today you received the following chemotherapy agents Rituxan   To help prevent nausea and vomiting after your treatment, we encourage you to take your nausea medication as prescribed If you develop nausea and vomiting that is not controlled by your nausea medication, call the clinic. If it is after clinic hours your family physician or the after hours number for the clinic or go to the Emergency Department.   BELOW ARE SYMPTOMS THAT SHOULD BE REPORTED IMMEDIATELY:  *FEVER GREATER THAN 100.5 F  *CHILLS WITH OR WITHOUT FEVER  NAUSEA AND VOMITING THAT IS NOT CONTROLLED WITH YOUR NAUSEA MEDICATION  *UNUSUAL SHORTNESS OF BREATH  *UNUSUAL BRUISING OR BLEEDING  TENDERNESS IN MOUTH AND THROAT WITH OR WITHOUT PRESENCE OF ULCERS  *URINARY PROBLEMS  *BOWEL PROBLEMS  UNUSUAL RASH Items with * indicate a potential emergency and should be followed up as soon as possible.  One of the nurses will contact you 24 hours after your treatment. Please let the nurse know about any problems that you may have experienced. Feel free to call the clinic you have any questions or concerns. The clinic phone number is [phone].   I have been informed and understand all the instructions given to me. I know to contact the clinic, my physician, or go to the Emergency Department if any problems should occur. I do not have any questions at this time, but understand that I may call the clinic during office hours   should I have any questions or need assistance in obtaining follow up care.    __________________________________________  _____________  __________ Signature of Patient or Authorized Representative            Date                   Time    __________________________________________ Nurse's Signature    

## 2012-04-09 ENCOUNTER — Ambulatory Visit: Payer: Self-pay | Admitting: Internal Medicine

## 2012-05-23 ENCOUNTER — Other Ambulatory Visit: Payer: Self-pay | Admitting: Oncology

## 2012-05-23 DIAGNOSIS — C8218 Follicular lymphoma grade II, lymph nodes of multiple sites: Secondary | ICD-10-CM

## 2012-05-24 ENCOUNTER — Other Ambulatory Visit: Payer: Self-pay | Admitting: Oncology

## 2012-05-26 ENCOUNTER — Telehealth: Payer: Self-pay | Admitting: *Deleted

## 2012-05-26 ENCOUNTER — Ambulatory Visit (HOSPITAL_BASED_OUTPATIENT_CLINIC_OR_DEPARTMENT_OTHER): Payer: Medicare Other | Admitting: Oncology

## 2012-05-26 ENCOUNTER — Encounter: Payer: Self-pay | Admitting: Oncology

## 2012-05-26 ENCOUNTER — Other Ambulatory Visit (HOSPITAL_BASED_OUTPATIENT_CLINIC_OR_DEPARTMENT_OTHER): Payer: Medicare Other | Admitting: Lab

## 2012-05-26 ENCOUNTER — Ambulatory Visit (HOSPITAL_BASED_OUTPATIENT_CLINIC_OR_DEPARTMENT_OTHER): Payer: Medicare Other

## 2012-05-26 ENCOUNTER — Telehealth: Payer: Self-pay | Admitting: Oncology

## 2012-05-26 VITALS — BP 129/80 | HR 84 | Temp 98.6°F | Resp 20 | Ht 70.0 in | Wt 169.6 lb

## 2012-05-26 VITALS — BP 119/78 | HR 76 | Temp 97.3°F | Resp 18

## 2012-05-26 DIAGNOSIS — C8298 Follicular lymphoma, unspecified, lymph nodes of multiple sites: Secondary | ICD-10-CM

## 2012-05-26 DIAGNOSIS — F17200 Nicotine dependence, unspecified, uncomplicated: Secondary | ICD-10-CM

## 2012-05-26 DIAGNOSIS — C8590 Non-Hodgkin lymphoma, unspecified, unspecified site: Secondary | ICD-10-CM

## 2012-05-26 DIAGNOSIS — C8218 Follicular lymphoma grade II, lymph nodes of multiple sites: Secondary | ICD-10-CM

## 2012-05-26 DIAGNOSIS — Z5112 Encounter for antineoplastic immunotherapy: Secondary | ICD-10-CM

## 2012-05-26 DIAGNOSIS — G8929 Other chronic pain: Secondary | ICD-10-CM

## 2012-05-26 LAB — CBC WITH DIFFERENTIAL/PLATELET
BASO%: 0.6 % (ref 0.0–2.0)
EOS%: 2.5 % (ref 0.0–7.0)
HCT: 46.8 % (ref 38.4–49.9)
LYMPH%: 18.9 % (ref 14.0–49.0)
MCH: 29 pg (ref 27.2–33.4)
MCHC: 34.2 g/dL (ref 32.0–36.0)
MCV: 84.9 fL (ref 79.3–98.0)
MONO%: 7.6 % (ref 0.0–14.0)
NEUT%: 70.4 % (ref 39.0–75.0)
Platelets: 189 10*3/uL (ref 140–400)
RBC: 5.51 10*6/uL (ref 4.20–5.82)
WBC: 5.1 10*3/uL (ref 4.0–10.3)

## 2012-05-26 LAB — COMPREHENSIVE METABOLIC PANEL (CC13)
ALT: 10 U/L (ref 0–55)
Alkaline Phosphatase: 56 U/L (ref 40–150)
CO2: 26 mEq/L (ref 22–29)
Creatinine: 0.8 mg/dL (ref 0.7–1.3)
Sodium: 141 mEq/L (ref 136–145)
Total Bilirubin: 0.8 mg/dL (ref 0.20–1.20)
Total Protein: 6.3 g/dL — ABNORMAL LOW (ref 6.4–8.3)

## 2012-05-26 LAB — LACTATE DEHYDROGENASE (CC13): LDH: 176 U/L (ref 125–220)

## 2012-05-26 MED ORDER — SODIUM CHLORIDE 0.9 % IV SOLN
375.0000 mg/m2 | Freq: Once | INTRAVENOUS | Status: AC
  Administered 2012-05-26: 700 mg via INTRAVENOUS
  Filled 2012-05-26: qty 70

## 2012-05-26 MED ORDER — HEPARIN SOD (PORK) LOCK FLUSH 100 UNIT/ML IV SOLN
500.0000 [IU] | Freq: Once | INTRAVENOUS | Status: AC | PRN
  Administered 2012-05-26: 500 [IU]
  Filled 2012-05-26: qty 5

## 2012-05-26 MED ORDER — DIPHENHYDRAMINE HCL 25 MG PO CAPS
50.0000 mg | ORAL_CAPSULE | Freq: Once | ORAL | Status: AC
  Administered 2012-05-26: 50 mg via ORAL

## 2012-05-26 MED ORDER — ACETAMINOPHEN 325 MG PO TABS
650.0000 mg | ORAL_TABLET | Freq: Once | ORAL | Status: AC
  Administered 2012-05-26: 650 mg via ORAL

## 2012-05-26 MED ORDER — SODIUM CHLORIDE 0.9 % IJ SOLN
10.0000 mL | INTRAMUSCULAR | Status: DC | PRN
  Administered 2012-05-26: 10 mL
  Filled 2012-05-26: qty 10

## 2012-05-26 MED ORDER — SODIUM CHLORIDE 0.9 % IV SOLN
Freq: Once | INTRAVENOUS | Status: AC
  Administered 2012-05-26: 09:00:00 via INTRAVENOUS

## 2012-05-26 NOTE — Progress Notes (Signed)
[facility]  Telephone:(336) [phone] Fax:(336) [phone]   OFFICE PROGRESS NOTE   Cc:  Bosie Clos, MD [name], M.D.    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   CURRENT THERAPY: started on maintenance q2 month Rituxan on 08/16/2010 with plan for 2 years total.  INTERVAL HISTORY: Edwin Yu 56 y.o. male returns for regular follow up by himself.  He reports feeling well. His left submandibular gland has slightly decreased in size. There was a drainage was than a few months ago. He does not palpate any little swelling well. He does have a stable left lower quadrant abdominal mass that was negative on previous CT scan and was negative on colonoscopy. This abdominal mass does not cause any pain, change in bowel bladder habit, or increased in size compared to before. He still smokes cigarettes however has cut down certainly 3 he smokes now about 5 cigarettes a day. He is on Wellbutrin in addition to nicotine patch and gum. He has chronic back pain and has been pain medication with pain clinic. The back pain has not worsens or has radiate. He denies any bowel bladder incontinence or neuropathy.  Patient denies fever, anorexia, weight loss, fatigue, headache, visual changes, confusion, drenching night sweats, palpable lymph node swelling, mucositis, odynophagia, dysphagia, nausea vomiting, jaundice, chest pain, palpitation, shortness of breath, dyspnea on exertion, productive cough, gum bleeding, epistaxis, hematemesis, hemoptysis, abdominal pain, abdominal swelling, early satiety, melena, hematochezia, hematuria, skin rash, spontaneous bleeding, joint swelling, joint pain, heat or cold  intolerance, bowel bladder incontinence, focal motor weakness, paresthesia, depression, suicidal or homocidal ideation, feeling hopelessness.   Past Medical History  Diagnosis Date  . 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  [name] Chronic lower back pain   . Hepatitis B antibody positive 2011    on Lamivudine until finish of maintenance Rituxan in 08/2012.  [name] Cancer     nhl    History reviewed. No pertinent past surgical history.  Current Outpatient Prescriptions  Medication Sig Dispense Refill  . ACTONEL 150 MG tablet Take 150 mg by mouth Every month.      [name] buPROPion (WELLBUTRIN XL) 150 MG 24 hr tablet Take 150 mg by mouth daily.       . diphenhydramine-acetaminophen (TYLENOL PM) 25-500 MG TABS Take 1 tablet by mouth at bedtime as needed.        [name] escitalopram (LEXAPRO) 10 MG tablet Take 5 mg by mouth Daily.      [name] lamivudine (EPIVIR) 100 MG tablet Take 1 tablet (100 mg total) by mouth daily.  30 tablet  3  . lidocaine-prilocaine (EMLA) cream Apply 1 application topically as needed. Apply to porta cath site one hour prior to needle access  30 g  1  . morphine (MS CONTIN) 100 MG 12 hr tablet Take 100 mg by mouth 3 (three) times daily.        [name] oxyCODONE (OXY IR/ROXICODONE) 5 MG immediate release tablet Take 5 mg by mouth 4 (four) times daily.       . riTUXimab (RITUXAN) 10  MG/ML injection Inject into the vein.      [name] testosterone cypionate (DEPOTESTOTERONE CYPIONATE) 200 MG/ML injection Inject 200 mg into the muscle Every 14 days.       No current facility-administered medications for this visit.   Facility-Administered Medications Ordered in Other Visits  Medication Dose Route Frequency Provider Last Rate Last Dose  . 0.9 %  sodium chloride infusion   Intravenous Once Exie Parody, MD 20 mL/hr at 05/26/12 0925    . acetaminophen  (TYLENOL) tablet 650 mg  650 mg Oral Once Exie Parody, MD   650 mg at 05/26/12 0930  . diphenhydrAMINE (BENADRYL) capsule 50 mg  50 mg Oral Once Exie Parody, MD   50 mg at 05/26/12 0930  . heparin lock flush 100 unit/mL  500 Units Intracatheter Once PRN Exie Parody, MD      . riTUXimab (RITUXAN) 700 mg in sodium chloride 0.9 % 250 mL chemo infusion  375 mg/m2 (Treatment Plan Actual) Intravenous Once Exie Parody, MD      . sodium chloride 0.9 % injection 10 mL  10 mL Intracatheter PRN Exie Parody, MD        ALLERGIES:   has no known allergies.  REVIEW OF SYSTEMS:  The rest of the 14-point review of system was negative.   Filed Vitals:   05/26/12 0847  BP: 129/80  Pulse: 84  Temp: 98.6 F (37 C)  Resp: 20   Wt Readings from Last 3 Encounters:  05/26/12 169 lb 9.6 oz (76.93 kg)  03/24/12 165 lb 12.8 oz (75.206 kg)  02/27/12 160 lb (72.576 kg)   ECOG Performance status: 1 due to chronic back pain  PHYSICAL EXAMINATION:   General: Thin-appearing man in no acute distress. Eyes: no scleral icterus. ENT: There were no oropharyngeal lesions. Neck was without thyromegaly. Lymphatics: Negative cervical, supraclavicular or axillary adenopathy. He has minimal swelling of submandibular salivary gland with a dried scab. Respiratory: lungs were clear bilaterally without wheezing or crackles. Cardiovascular: Regular rate and rhythm, S1/S2, without murmur, rub or gallop. There was no pedal edema. GI: abdomen was soft, flat, nontender, nondistended, without organomegaly. I again was able to feel a 4x2 cm left lower quadrant mobile mass that wasnontender to palpation. Muscoloskeletal: no spinal tenderness of palpation of vertebral spine. Skin exam was without echymosis, petichae. Neuro exam was nonfocal. Patient was able to get on and off exam table without assistance. Gait was normal. Patient was alerted and oriented. Attention was good. Language was appropriate. Mood was normal without depression. Speech was not  pressured. Thought content was not tangential.    LABORATORY/RADIOLOGY DATA:  Lab Results  Component Value Date   WBC 5.1 05/26/2012   HGB 16.0 05/26/2012   HCT 46.8 05/26/2012   PLT 189 05/26/2012   GLUCOSE 104* 03/24/2012   ALKPHOS 53 03/24/2012   ALT 10 03/24/2012   AST 11 03/24/2012   NA 142 03/24/2012   K 3.7 03/24/2012   CL 103 03/24/2012   CREATININE 0.74 03/24/2012   BUN 6 03/24/2012   CO2 29 03/24/2012   INR 0.89 07/06/2010    ASSESSMENT AND PLAN:   1. Left salivary gland swelling and tenderness: improved.  This is most consistent with a retained salivary stone.  2. History of lymphoma: He would like to proceed with Rituxan today. He has not had any side effect from maintenance Rituxan. The last surveillance CT scan in May 2013 was negative.  The next surveillance CT  is due in November 2013 and has been ordered today.  3. Family history of colon cancer in his father with a palpable LLQ abdominal mass. He saw Dr. Elnoria Howard and had a negative colonoscopy in 2013.  4. Chronic low-back pain from osteoarthritis. He is on morphine sulfate and oxycodone per pain clinic.  5. History of hepatitis B. He is on lamivudine for the duration of Rituxan therapy.  6. Smoking: he now smokes 5 cigarettes a day. He is on bupropion and nicotine patch and gum. I advised him to him to go off cigarettes at the end of this year. 7. Followup: In about 2 months with same day Rituxan infusion.

## 2012-05-26 NOTE — Patient Instructions (Signed)
[facility] Discharge Instructions for Patients Receiving Chemotherapy  Today you received the following chemotherapy agents Rituxan  To help prevent nausea and vomiting after your treatment, we encourage you to take your nausea medication as directed.   If you develop nausea and vomiting that is not controlled by your nausea medication, call the clinic. If it is after clinic hours your family physician or the after hours number for the clinic or go to the Emergency Department.   BELOW ARE SYMPTOMS THAT SHOULD BE REPORTED IMMEDIATELY:  *FEVER GREATER THAN 100.5 F  *CHILLS WITH OR WITHOUT FEVER  NAUSEA AND VOMITING THAT IS NOT CONTROLLED WITH YOUR NAUSEA MEDICATION  *UNUSUAL SHORTNESS OF BREATH  *UNUSUAL BRUISING OR BLEEDING  TENDERNESS IN MOUTH AND THROAT WITH OR WITHOUT PRESENCE OF ULCERS  *URINARY PROBLEMS  *BOWEL PROBLEMS  UNUSUAL RASH Items with * indicate a potential emergency and should be followed up as soon as possible.  One of the nurses will contact you 24 hours after your treatment. Please let the nurse know about any problems that you may have experienced. Feel free to call the clinic you have any questions or concerns. The clinic phone number is [phone].   I have been informed and understand all the instructions given to me. I know to contact the clinic, my physician, or go to the Emergency Department if any problems should occur. I do not have any questions at this time, but understand that I may call the clinic during office hours   should I have any questions or need assistance in obtaining follow up care.    __________________________________________  _____________  __________ Signature of Patient or Authorized Representative            Date                   Time    __________________________________________ Nurse's Signature    

## 2012-05-26 NOTE — Patient Instructions (Addendum)
1. Non Hodgkin's lymphoma: Stable. Continue with the Rituxan.  2. Follow up in about 2 months. Next surveillance CT scan will in November.

## 2012-05-26 NOTE — Telephone Encounter (Signed)
Per staff message and POF I have scheduled appt.  JMW  

## 2012-05-26 NOTE — Telephone Encounter (Signed)
appts made and printed for pt ,mw to add tx and pt is aware   aom

## 2012-07-21 ENCOUNTER — Other Ambulatory Visit: Payer: Self-pay | Admitting: Oncology

## 2012-07-22 ENCOUNTER — Other Ambulatory Visit (HOSPITAL_BASED_OUTPATIENT_CLINIC_OR_DEPARTMENT_OTHER): Payer: Medicare Other | Admitting: Lab

## 2012-07-22 ENCOUNTER — Ambulatory Visit (HOSPITAL_COMMUNITY)
Admission: RE | Admit: 2012-07-22 | Discharge: 2012-07-22 | Disposition: A | Discharge status: Home / Self Care | Payer: Medicare Other | Source: Ambulatory Visit | Attending: Oncology | Admitting: Oncology

## 2012-07-22 DIAGNOSIS — C8589 Other specified types of non-Hodgkin lymphoma, extranodal and solid organ sites: Secondary | ICD-10-CM | POA: Insufficient documentation

## 2012-07-22 DIAGNOSIS — K838 Other specified diseases of biliary tract: Secondary | ICD-10-CM | POA: Insufficient documentation

## 2012-07-22 DIAGNOSIS — K8020 Calculus of gallbladder without cholecystitis without obstruction: Secondary | ICD-10-CM | POA: Insufficient documentation

## 2012-07-22 DIAGNOSIS — C8218 Follicular lymphoma grade II, lymph nodes of multiple sites: Secondary | ICD-10-CM

## 2012-07-22 DIAGNOSIS — D739 Disease of spleen, unspecified: Secondary | ICD-10-CM | POA: Insufficient documentation

## 2012-07-22 DIAGNOSIS — C8298 Follicular lymphoma, unspecified, lymph nodes of multiple sites: Secondary | ICD-10-CM

## 2012-07-22 LAB — COMPREHENSIVE METABOLIC PANEL (CC13)
ALT: 12 U/L (ref 0–55)
Albumin: 3.9 g/dL (ref 3.5–5.0)
CO2: 31 mEq/L — ABNORMAL HIGH (ref 22–29)
Glucose: 98 mg/dl (ref 70–99)
Potassium: 3.8 mEq/L (ref 3.5–5.1)
Sodium: 140 mEq/L (ref 136–145)
Total Protein: 6.4 g/dL (ref 6.4–8.3)

## 2012-07-22 LAB — CBC WITH DIFFERENTIAL/PLATELET
BASO%: 0.4 % (ref 0.0–2.0)
Eosinophils Absolute: 0.1 10*3/uL (ref 0.0–0.5)
MONO#: 0.5 10*3/uL (ref 0.1–0.9)
NEUT#: 3.9 10*3/uL (ref 1.5–6.5)
Platelets: 234 10*3/uL (ref 140–400)
RBC: 5.53 10*6/uL (ref 4.20–5.82)
RDW: 13.7 % (ref 11.0–14.6)
WBC: 5.6 10*3/uL (ref 4.0–10.3)
lymph#: 1 10*3/uL (ref 0.9–3.3)

## 2012-07-22 LAB — LACTATE DEHYDROGENASE (CC13): LDH: 117 U/L — ABNORMAL LOW (ref 125–245)

## 2012-07-22 MED ORDER — IOHEXOL 300 MG/ML  SOLN
100.0000 mL | Freq: Once | INTRAMUSCULAR | Status: AC | PRN
  Administered 2012-07-22: 100 mL via INTRAVENOUS

## 2012-07-24 ENCOUNTER — Telehealth: Payer: Self-pay | Admitting: Oncology

## 2012-07-24 ENCOUNTER — Ambulatory Visit (HOSPITAL_BASED_OUTPATIENT_CLINIC_OR_DEPARTMENT_OTHER): Payer: Medicare Other

## 2012-07-24 ENCOUNTER — Ambulatory Visit (HOSPITAL_BASED_OUTPATIENT_CLINIC_OR_DEPARTMENT_OTHER): Payer: Medicare Other | Admitting: Oncology

## 2012-07-24 ENCOUNTER — Encounter: Payer: Self-pay | Admitting: Oncology

## 2012-07-24 VITALS — BP 126/82 | HR 91 | Temp 98.0°F | Resp 20 | Ht 70.0 in | Wt 178.2 lb

## 2012-07-24 VITALS — BP 126/70 | HR 69 | Temp 97.9°F | Resp 12

## 2012-07-24 DIAGNOSIS — C8590 Non-Hodgkin lymphoma, unspecified, unspecified site: Secondary | ICD-10-CM

## 2012-07-24 DIAGNOSIS — C8218 Follicular lymphoma grade II, lymph nodes of multiple sites: Secondary | ICD-10-CM

## 2012-07-24 DIAGNOSIS — C8298 Follicular lymphoma, unspecified, lymph nodes of multiple sites: Secondary | ICD-10-CM

## 2012-07-24 DIAGNOSIS — R768 Other specified abnormal immunological findings in serum: Secondary | ICD-10-CM

## 2012-07-24 DIAGNOSIS — F17200 Nicotine dependence, unspecified, uncomplicated: Secondary | ICD-10-CM

## 2012-07-24 DIAGNOSIS — Z5112 Encounter for antineoplastic immunotherapy: Secondary | ICD-10-CM

## 2012-07-24 DIAGNOSIS — G8929 Other chronic pain: Secondary | ICD-10-CM

## 2012-07-24 MED ORDER — SODIUM CHLORIDE 0.9 % IV SOLN
375.0000 mg/m2 | Freq: Once | INTRAVENOUS | Status: AC
  Administered 2012-07-24: 700 mg via INTRAVENOUS
  Filled 2012-07-24: qty 70

## 2012-07-24 MED ORDER — LAMIVUDINE 100 MG PO TABS: 100.0000 mg | ORAL_TABLET | Freq: Every day | ORAL | Status: DC

## 2012-07-24 MED ORDER — SODIUM CHLORIDE 0.9 % IV SOLN
Freq: Once | INTRAVENOUS | Status: AC
  Administered 2012-07-24: 09:00:00 via INTRAVENOUS

## 2012-07-24 MED ORDER — ACETAMINOPHEN 325 MG PO TABS
650.0000 mg | ORAL_TABLET | Freq: Once | ORAL | Status: AC
  Administered 2012-07-24: 650 mg via ORAL

## 2012-07-24 MED ORDER — SODIUM CHLORIDE 0.9 % IJ SOLN
10.0000 mL | INTRAMUSCULAR | Status: DC | PRN
  Administered 2012-07-24: 10 mL
  Filled 2012-07-24: qty 10

## 2012-07-24 MED ORDER — DIPHENHYDRAMINE HCL 25 MG PO CAPS
50.0000 mg | ORAL_CAPSULE | Freq: Once | ORAL | Status: AC
  Administered 2012-07-24: 50 mg via ORAL

## 2012-07-24 MED ORDER — HEPARIN SOD (PORK) LOCK FLUSH 100 UNIT/ML IV SOLN
500.0000 [IU] | Freq: Once | INTRAVENOUS | Status: AC | PRN
  Administered 2012-07-24: 500 [IU]
  Filled 2012-07-24: qty 5

## 2012-07-24 NOTE — Patient Instructions (Addendum)
[facility] Discharge Instructions for Patients Receiving Chemotherapy  Today you received the following chemotherapy agents Rituxan  To help prevent nausea and vomiting after your treatment, we encourage you to take your nausea medication as prescribed.   If you develop nausea and vomiting that is not controlled by your nausea medication, call the clinic. If it is after clinic hours your family physician or the after hours number for the clinic or go to the Emergency Department.   BELOW ARE SYMPTOMS THAT SHOULD BE REPORTED IMMEDIATELY:  *FEVER GREATER THAN 100.5 F  *CHILLS WITH OR WITHOUT FEVER  NAUSEA AND VOMITING THAT IS NOT CONTROLLED WITH YOUR NAUSEA MEDICATION  *UNUSUAL SHORTNESS OF BREATH  *UNUSUAL BRUISING OR BLEEDING  TENDERNESS IN MOUTH AND THROAT WITH OR WITHOUT PRESENCE OF ULCERS  *URINARY PROBLEMS  *BOWEL PROBLEMS  UNUSUAL RASH Items with * indicate a potential emergency and should be followed up as soon as possible.  Feel free to call the clinic you have any questions or concerns. The clinic phone number is [phone].   I have been informed and understand all the instructions given to me. I know to contact the clinic, my physician, or go to the Emergency Department if any problems should occur. I do not have any questions at this time, but understand that I may call the clinic during office hours   should I have any questions or need assistance in obtaining follow up care.    

## 2012-07-24 NOTE — Patient Instructions (Addendum)
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  3. CURRENT THERAPY: started on maintenance q2 month Rituxan on 08/16/2010 with plan for 2 years total.  Today is the last dose.  4. CT scan 07/22/12 showed no lymphoma.  5.  Follow up:  Lab/visit in 4 months.  Next CT scan in about 1 year; but sooner if concerning symptoms.

## 2012-07-24 NOTE — Progress Notes (Signed)
[facility]  Telephone:(336) [phone] Fax:(336) [phone]   OFFICE PROGRESS NOTE   Cc:  Bosie Clos, MD [name], M.D.    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   CURRENT THERAPY: started on maintenance q2 month Rituxan on 08/16/2010 with plan for 2 years total.  INTERVAL HISTORY: Edwin Yu 56 y.o. male returns for regular follow up by himself.  He reports feeling well.  He still has intermittent drainage from the left salivary gland.  He does not noted any frank adenopathy. He has chronic lower back pain for many years starting from before diagnosis of lymphoma.  He denied lower extremity leg weakness, paresthesia, bowel/bladder incontinence.   Patient denies fever, anorexia, weight loss, fatigue, headache, visual changes, confusion, drenching night sweats, mucositis, odynophagia, dysphagia, nausea vomiting, jaundice, chest pain, palpitation, shortness of breath, dyspnea on exertion, productive cough, gum bleeding, epistaxis, hematemesis, hemoptysis, abdominal pain, abdominal swelling, early satiety, melena, hematochezia, hematuria, skin rash, spontaneous bleeding, joint swelling, joint pain, heat or cold intolerance, bowel bladder incontinence, focal motor weakness, paresthesia, depression, suicidal or homocidal ideation, feeling hopelessness.   Past Medical History  Diagnosis Date  . 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  [name] Chronic lower back pain   . Hepatitis B antibody positive 2011    on Lamivudine  until finish of maintenance Rituxan in 08/2012.  [name] Cancer     nhl    No past surgical history on file.  Current Outpatient Prescriptions  Medication Sig Dispense Refill  . ACTONEL 150 MG tablet Take 150 mg by mouth Every month.      [name] buPROPion (WELLBUTRIN XL) 150 MG 24 hr tablet Take 150 mg by mouth daily.       . diphenhydramine-acetaminophen (TYLENOL PM) 25-500 MG TABS Take 1 tablet by mouth at bedtime as needed.        [name] escitalopram (LEXAPRO) 10 MG tablet Take 5 mg by mouth Daily.      [name] lamivudine (EPIVIR) 100 MG tablet Take 1 tablet (100 mg total) by mouth daily.  90 tablet  0  . lidocaine-prilocaine (EMLA) cream Apply 1 application topically as needed. Apply to porta cath site one hour prior to needle access  30 g  1  . morphine (MS CONTIN) 100 MG 12 hr tablet Take 100 mg by mouth 3 (three) times daily.        [name] oxyCODONE (OXY IR/ROXICODONE) 5 MG immediate release tablet Take 5 mg by mouth 4 (four) times daily.       . riTUXimab (RITUXAN) 10 MG/ML injection Inject into the vein.      [name] testosterone cypionate (DEPOTESTOTERONE CYPIONATE) 200 MG/ML injection Inject 200 mg into the muscle Every 14 days.       No current facility-administered medications for this visit.   Facility-Administered Medications Ordered in Other Visits  Medication Dose Route Frequency Provider Last Rate Last Dose  . [COMPLETED] 0.9 %  sodium chloride infusion   Intravenous Once Exie Parody, MD      . [address] acetaminophen (TYLENOL) tablet  650 mg  650 mg Oral Once Exie Parody, MD   650 mg at 07/24/12 0915  . [COMPLETED] diphenhydrAMINE (BENADRYL) capsule 50 mg  50 mg Oral Once Exie Parody, MD   50 mg at 07/24/12 0915  . [COMPLETED] heparin lock flush 100 unit/mL  500 Units Intracatheter Once PRN Exie Parody, MD   500 Units at 07/24/12 1243  . [COMPLETED] riTUXimab (RITUXAN) 700 mg in sodium chloride 0.9 % 250 mL chemo infusion  375 mg/m2 (Treatment Plan Actual) Intravenous Once Exie Parody, MD   700 mg at 07/24/12  1155  . sodium chloride 0.9 % injection 10 mL  10 mL Intracatheter PRN Exie Parody, MD   10 mL at 07/24/12 1243    ALLERGIES:   has no known allergies.  REVIEW OF SYSTEMS:  The rest of the 14-point review of system was negative.   Filed Vitals:   07/24/12 0838  BP: 126/82  Pulse: 91  Temp: 98 F (36.7 C)  Resp: 20   Wt Readings from Last 3 Encounters:  07/24/12 178 lb 3.2 oz (80.831 kg)  05/26/12 169 lb 9.6 oz (76.93 kg)  03/24/12 165 lb 12.8 oz (75.206 kg)   ECOG Performance status: 1 due to chronic back pain  PHYSICAL EXAMINATION:   General: Thin-appearing man in no acute distress. Eyes: no scleral icterus. ENT: There were no oropharyngeal lesions. Neck was without thyromegaly. Lymphatics: Negative cervical, supraclavicular or axillary adenopathy. He has minimal swelling of submandibular salivary gland with a dried scab. Respiratory: lungs were clear bilaterally without wheezing or crackles. Cardiovascular: Regular rate and rhythm, S1/S2, without murmur, rub or gallop. There was no pedal edema. GI: abdomen was soft, flat, nontender, nondistended, without organomegaly. I could not palpate any left lower quadrant mobile mass that wasnontender to palpation. Muscoloskeletal: no spinal tenderness of palpation of vertebral spine. Skin exam was without echymosis, petichae. Neuro exam was nonfocal. Patient was able to get on and off exam table without assistance. Gait was normal. Patient was alerted and oriented. Attention was good. Language was appropriate. Mood was normal without depression. Speech was not pressured. Thought content was not tangential.    LABORATORY/RADIOLOGY DATA:  Lab Results  Component Value Date   WBC 5.6 07/22/2012   HGB 16.3 07/22/2012   HCT 47.5 07/22/2012   PLT 234 07/22/2012   GLUCOSE 98 07/22/2012   ALKPHOS 42 07/22/2012   ALT 12 07/22/2012   AST 14 07/22/2012   NA 140 07/22/2012   K 3.8 07/22/2012   CL 102 07/22/2012   CREATININE 0.9 07/22/2012   BUN  8.0 07/22/2012   CO2 31* 07/22/2012   INR 0.89 07/06/2010    IMAGING:  I personally reviewed the CT chest/abd/pel that was obtained earlier this week and showed the images to the patient.  There was no sign of recurrent lymphoma.   ASSESSMENT AND PLAN:   1. Left salivary gland swelling and tenderness: intermittent draining.  No sign of infection.  F/U with ENT prn.  2. History of lymphoma: He is s/p 11 cycles of q24month Rituxan.  He did not have complication .  I advised him to proceed with the last cycle today.  There is no data to support prolonging maintenance Rituxan past two years due to concern of possible side effects.  He agreed with the recommendation to stop afer today dose.   3. Family history of colon cancer in his father with a palpable LLQ abdominal mass. He saw Dr. Elnoria Howard  and had a negative colonoscopy in 2013.  4. Chronic low-back pain from osteoarthritis. He is on cyclobenzaprine, morphine sulfate and oxycodone per pain clinic.  5. History of hepatitis B. I advised him to prolong lamuvidine for about two more months to prevent reactivation.  Then, he can stop it.   6. Smoking: he now smokes about 3 cigarettes a day. He is on bupropion and nicotine patch and gum.  He said that he would try to quit soon.  He did not like electronic cigarettes.  7. Followup: every 4 months.  Next CT scan is due to 07/2013 but sooner if he has concerning symptoms.

## 2012-07-24 NOTE — Telephone Encounter (Signed)
Gave pt appt for lab and MD on March 2014 °

## 2012-07-24 NOTE — Treatment Plan (Signed)
[facility] END OF TREATMENT   Name: Edwin Yu Date: 07/24/2012 MRN: [id number] DOB: Jan 22, 1956   TREATMENT DATES: between 08/2010 and 07/2012.    REFERRING PHYSICIAN: No ref. provider found   DIAGNOSIS: follicular lymphoma.    STAGE AT START OF TREATMENT: IV.    INTENT:Maintenance   DRUGS OR REGIMENS GIVEN: q2 month Rituxan.    MAJOR TOXICITIES: none.    REASON TREATMENT STOPPED: finish of planned treatment.    PERFORMANCE STATUS AT END:  ECOG 1   ONGOING PROBLEMS:  None.    FOLLOW UP PLANS: in about 4 months.

## 2012-07-25 ENCOUNTER — Telehealth: Payer: Self-pay | Admitting: Oncology

## 2012-07-25 ENCOUNTER — Encounter: Payer: Self-pay | Admitting: Oncology

## 2012-07-25 NOTE — Telephone Encounter (Signed)
lvm for the pt in regards to Jan 2014 appt and to come get an updated schedule....mailed an updated appt schedule to pt for Jan 2014

## 2012-07-25 NOTE — Progress Notes (Signed)
Faxed epivir patient assistance application to [place] [employer].  I gave the patient a copy to call and check on the status Tuesday or Weds.

## 2012-07-30 ENCOUNTER — Encounter: Payer: Self-pay | Admitting: Oncology

## 2012-07-30 NOTE — Progress Notes (Addendum)
Patient has been approved in [employer] from 07/29/12 to 07/24/13 they will ship him a 90 day supply. His patient ID is [id number]. This is for his Epivir.

## 2012-10-17 ENCOUNTER — Telehealth: Payer: Self-pay | Admitting: Oncology

## 2012-10-17 NOTE — Telephone Encounter (Signed)
returned pt call and r/s flush for 2.20.14.Marland KitchenMarland Kitchenpt ok and aware

## 2012-10-23 ENCOUNTER — Ambulatory Visit (HOSPITAL_BASED_OUTPATIENT_CLINIC_OR_DEPARTMENT_OTHER): Payer: Medicare Other

## 2012-10-23 VITALS — BP 126/82 | HR 85 | Temp 98.6°F

## 2012-10-23 DIAGNOSIS — Z452 Encounter for adjustment and management of vascular access device: Secondary | ICD-10-CM

## 2012-10-23 DIAGNOSIS — C8210 Follicular lymphoma grade II, unspecified site: Secondary | ICD-10-CM

## 2012-10-23 DIAGNOSIS — C8299 Follicular lymphoma, unspecified, extranodal and solid organ sites: Secondary | ICD-10-CM

## 2012-10-23 MED ORDER — SODIUM CHLORIDE 0.9 % IJ SOLN
10.0000 mL | INTRAMUSCULAR | Status: DC | PRN
  Administered 2012-10-23: 10 mL via INTRAVENOUS
  Filled 2012-10-23: qty 10

## 2012-10-23 MED ORDER — HEPARIN SOD (PORK) LOCK FLUSH 100 UNIT/ML IV SOLN
500.0000 [IU] | Freq: Once | INTRAVENOUS | Status: AC
  Administered 2012-10-23: 500 [IU] via INTRAVENOUS
  Filled 2012-10-23: qty 5

## 2012-10-24 ENCOUNTER — Telehealth: Payer: Self-pay | Admitting: Oncology

## 2012-11-21 ENCOUNTER — Ambulatory Visit: Payer: Self-pay | Admitting: Oncology

## 2012-11-21 ENCOUNTER — Other Ambulatory Visit (HOSPITAL_BASED_OUTPATIENT_CLINIC_OR_DEPARTMENT_OTHER): Payer: Medicare Other | Admitting: Lab

## 2012-11-21 DIAGNOSIS — C8218 Follicular lymphoma grade II, lymph nodes of multiple sites: Secondary | ICD-10-CM

## 2012-11-21 DIAGNOSIS — C8298 Follicular lymphoma, unspecified, lymph nodes of multiple sites: Secondary | ICD-10-CM

## 2012-11-21 DIAGNOSIS — R894 Abnormal immunological findings in specimens from other organs, systems and tissues: Secondary | ICD-10-CM

## 2012-11-21 DIAGNOSIS — R768 Other specified abnormal immunological findings in serum: Secondary | ICD-10-CM

## 2012-11-21 LAB — COMPREHENSIVE METABOLIC PANEL (CC13)
AST: 17 U/L (ref 5–34)
BUN: 5.4 mg/dL — ABNORMAL LOW (ref 7.0–26.0)
CO2: 34 mEq/L — ABNORMAL HIGH (ref 22–29)
Calcium: 9.5 mg/dL (ref 8.4–10.4)
Chloride: 98 mEq/L (ref 98–107)
Creatinine: 1 mg/dL (ref 0.7–1.3)
Glucose: 101 mg/dl — ABNORMAL HIGH (ref 70–99)

## 2012-11-21 LAB — CBC WITH DIFFERENTIAL/PLATELET
BASO%: 0.3 % (ref 0.0–2.0)
Basophils Absolute: 0 10*3/uL (ref 0.0–0.1)
HCT: 50.5 % — ABNORMAL HIGH (ref 38.4–49.9)
LYMPH%: 16.3 % (ref 14.0–49.0)
MCH: 28.3 pg (ref 27.2–33.4)
MCHC: 33.8 g/dL (ref 32.0–36.0)
MONO#: 0.7 10*3/uL (ref 0.1–0.9)
NEUT%: 71.9 % (ref 39.0–75.0)
Platelets: 221 10*3/uL (ref 140–400)
WBC: 7.2 10*3/uL (ref 4.0–10.3)

## 2012-11-26 ENCOUNTER — Other Ambulatory Visit: Payer: Self-pay | Admitting: Oncology

## 2012-11-27 ENCOUNTER — Other Ambulatory Visit: Payer: Self-pay

## 2012-11-27 ENCOUNTER — Encounter: Payer: Self-pay | Admitting: Oncology

## 2012-11-27 NOTE — Progress Notes (Signed)
This encounter was created in error - please disregard.

## 2012-11-28 ENCOUNTER — Telehealth: Payer: Self-pay | Admitting: Oncology

## 2012-11-28 NOTE — Telephone Encounter (Signed)
s.w and r/s missed appts...pt ok and aware

## 2012-12-06 ENCOUNTER — Other Ambulatory Visit: Payer: Self-pay | Admitting: Oncology

## 2012-12-06 DIAGNOSIS — C8218 Follicular lymphoma grade II, lymph nodes of multiple sites: Secondary | ICD-10-CM

## 2012-12-08 ENCOUNTER — Ambulatory Visit (HOSPITAL_BASED_OUTPATIENT_CLINIC_OR_DEPARTMENT_OTHER): Payer: Medicare Other | Admitting: Oncology

## 2012-12-08 ENCOUNTER — Encounter: Payer: Self-pay | Admitting: Oncology

## 2012-12-08 ENCOUNTER — Ambulatory Visit: Payer: Medicare Other

## 2012-12-08 ENCOUNTER — Other Ambulatory Visit (HOSPITAL_BASED_OUTPATIENT_CLINIC_OR_DEPARTMENT_OTHER): Payer: Medicare Other | Admitting: Lab

## 2012-12-08 VITALS — BP 142/85 | HR 95 | Temp 98.0°F | Resp 20 | Ht 70.0 in | Wt 180.0 lb

## 2012-12-08 DIAGNOSIS — C8218 Follicular lymphoma grade II, lymph nodes of multiple sites: Secondary | ICD-10-CM

## 2012-12-08 DIAGNOSIS — C8290 Follicular lymphoma, unspecified, unspecified site: Secondary | ICD-10-CM

## 2012-12-08 DIAGNOSIS — G8929 Other chronic pain: Secondary | ICD-10-CM

## 2012-12-08 DIAGNOSIS — R599 Enlarged lymph nodes, unspecified: Secondary | ICD-10-CM

## 2012-12-08 DIAGNOSIS — Z85038 Personal history of other malignant neoplasm of large intestine: Secondary | ICD-10-CM

## 2012-12-08 DIAGNOSIS — C8298 Follicular lymphoma, unspecified, lymph nodes of multiple sites: Secondary | ICD-10-CM

## 2012-12-08 LAB — COMPREHENSIVE METABOLIC PANEL (CC13)
Albumin: 4 g/dL (ref 3.5–5.0)
BUN: 9.5 mg/dL (ref 7.0–26.0)
CO2: 32 mEq/L — ABNORMAL HIGH (ref 22–29)
Calcium: 9.9 mg/dL (ref 8.4–10.4)
Chloride: 99 mEq/L (ref 98–107)
Creatinine: 1.1 mg/dL (ref 0.7–1.3)
Potassium: 3.9 mEq/L (ref 3.5–5.1)

## 2012-12-08 LAB — CBC WITH DIFFERENTIAL/PLATELET
Basophils Absolute: 0 10*3/uL (ref 0.0–0.1)
Eosinophils Absolute: 0.1 10*3/uL (ref 0.0–0.5)
HCT: 50.2 % — ABNORMAL HIGH (ref 38.4–49.9)
HGB: 16.6 g/dL (ref 13.0–17.1)
MONO#: 0.8 10*3/uL (ref 0.1–0.9)
NEUT#: 6.5 10*3/uL (ref 1.5–6.5)
NEUT%: 75.1 % — ABNORMAL HIGH (ref 39.0–75.0)
RDW: 13.5 % (ref 11.0–14.6)
WBC: 8.6 10*3/uL (ref 4.0–10.3)
lymph#: 1.2 10*3/uL (ref 0.9–3.3)

## 2012-12-08 LAB — LACTATE DEHYDROGENASE (CC13): LDH: 137 U/L (ref 125–245)

## 2012-12-08 MED ORDER — HEPARIN SOD (PORK) LOCK FLUSH 100 UNIT/ML IV SOLN
500.0000 [IU] | Freq: Once | INTRAVENOUS | Status: AC
  Administered 2012-12-08: 500 [IU] via INTRAVENOUS
  Filled 2012-12-08: qty 5

## 2012-12-08 MED ORDER — SODIUM CHLORIDE 0.9 % IJ SOLN
10.0000 mL | INTRAMUSCULAR | Status: DC | PRN
  Administered 2012-12-08: 10 mL via INTRAVENOUS
  Filled 2012-12-08: qty 10

## 2012-12-08 NOTE — Progress Notes (Signed)
[facility]  Telephone:(336) [phone] Fax:(336) [phone]   OFFICE PROGRESS NOTE   Cc:  Bosie Clos, MD [name], M.D.    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 started on maintenance q2 month Rituxan on 08/16/2010 and completed 2 years of therapy on 07/24/12.  CURRENT THERAPY: Watchful observation.  INTERVAL HISTORY: Edwin Yu 57 y.o. male returns for regular follow up by himself.  He reports feeling well.  He still has intermittent drainage from the left salivary gland.  He does not noted any frank adenopathy. He has chronic lower back pain for many years starting from before diagnosis of lymphoma.  He denied lower extremity leg weakness, paresthesia, bowel/bladder incontinence. Notes night sweats over the past 2 months. No fatigue. He remains independent in ADLs. Appetite good. No weight loss noted.  Patient denies fever, anorexia, weight loss, fatigue, headache, visual changes, confusion, mucositis, odynophagia, dysphagia, nausea vomiting, jaundice, chest pain, palpitation, shortness of breath, dyspnea on exertion, productive cough, gum bleeding, epistaxis, hematemesis, hemoptysis, abdominal pain, abdominal swelling, early satiety, melena, hematochezia, hematuria, skin rash, spontaneous bleeding, joint swelling, joint pain, heat or cold intolerance, bowel bladder incontinence, focal motor weakness, paresthesia, depression, suicidal or homocidal ideation, feeling hopelessness.   Past Medical History  Diagnosis Date  . Follicular lymphoma grade II of lymph nodes of multiple sites 2011    stage IV; grade 2 but with focal transformation to grade 3; s/p RCHOP in 06/2010;  on maintenance Rituxan q2 months since 08/2010.  [name] Chronic lower back pain   . Hepatitis B antibody positive 2011    on Lamivudine until finish of maintenance Rituxan in 08/2012.  [name] Cancer     nhl    History reviewed. No pertinent past surgical history.  Current Outpatient Prescriptions  Medication Sig Dispense Refill  . ACTONEL 150 MG tablet Take 150 mg by mouth Every month.      [name] buPROPion (WELLBUTRIN XL) 150 MG 24 hr tablet Take 150 mg by mouth daily.       . diphenhydramine-acetaminophen (TYLENOL PM) 25-500 MG TABS Take 1 tablet by mouth at bedtime as needed.        [name] escitalopram (LEXAPRO) 10 MG tablet Take 5 mg by mouth Daily.      [name] lidocaine-prilocaine (EMLA) cream Apply 1 application topically as needed. Apply to porta cath site one hour prior to needle access  30 g  1  . morphine (MS CONTIN) 100 MG 12 hr tablet Take 100 mg by mouth 3 (three) times daily.        [name] oxyCODONE (OXY IR/ROXICODONE) 5 MG immediate release tablet Take 5 mg by mouth 4 (four) times daily.       [name] testosterone cypionate (DEPOTESTOTERONE CYPIONATE) 200 MG/ML injection Inject 200 mg into the muscle Every 14 days.       No current facility-administered medications for this visit.    ALLERGIES:  has No Known Allergies.  REVIEW OF SYSTEMS:  The rest of the 14-point review of system was negative.   Filed Vitals:   12/08/12 1519  BP: 142/85  Pulse: 95  Temp: 98 F (36.7 C)  Resp: 20   Wt Readings from Last 3 Encounters:  12/08/12 180 lb (81.647 kg)  07/24/12 178 lb 3.2 oz (80.831 kg)  05/26/12 169 lb 9.6 oz (76.93 kg)   ECOG Performance status: 1 due to chronic back pain  PHYSICAL EXAMINATION:   General: Thin-appearing man in no acute distress. Eyes: no scleral icterus. ENT: There were no oropharyngeal lesions. Neck was without thyromegaly. Lymphatics: Negative cervical, supraclavicular or axillary adenopathy. He has minimal swelling of submandibular salivary gland with a dried scab.  Respiratory: lungs were clear bilaterally without wheezing or crackles. Cardiovascular: Regular rate and rhythm, S1/S2, without murmur, rub or gallop. There was no pedal edema. GI: abdomen was soft, flat, nontender, nondistended, without organomegaly. I could not palpate any left lower quadrant mobile mass that wasnontender to palpation. Muscoloskeletal: no spinal tenderness of palpation of vertebral spine. Skin exam was without echymosis, petichae. Neuro exam was nonfocal. Patient was able to get on and off exam table without assistance. Gait was normal. Patient was alerted and oriented. Attention was good. Language was appropriate. Mood was normal without depression. Speech was not pressured. Thought content was not tangential.    LABORATORY/RADIOLOGY DATA:  Lab Results  Component Value Date   WBC 8.6 12/08/2012   HGB 16.6 12/08/2012   HCT 50.2* 12/08/2012   PLT 226 12/08/2012   GLUCOSE 93 12/08/2012   ALKPHOS 53 12/08/2012   ALT 15 12/08/2012   AST 18 12/08/2012   NA 140 12/08/2012   K 3.9 12/08/2012   CL 99 12/08/2012   CREATININE 1.1 12/08/2012   BUN 9.5 12/08/2012   CO2 32* 12/08/2012   INR 0.89 07/06/2010    ASSESSMENT AND PLAN:   1. Left salivary gland swelling and tenderness: intermittent draining.  No sign of infection.  F/U with ENT prn.  2. History of lymphoma: He is currently on observation. He reports night sweats without any other symptoms to suggest recurrence of his lymphoma. Discussed with Dr Gaylyn Rong and recommend continued observation at this time. I have told the patient to call us if he develops any fatigue, weight loss, lymphadenopathy, or drenching night sweats. Next routine surveillance CT is due in November 2014. 3. Family history of colon cancer in his father with a palpable LLQ abdominal mass. He saw Dr. Elnoria Howard and had a negative colonoscopy in 2013.  4. Chronic low-back pain from osteoarthritis. He is on cyclobenzaprine, morphine sulfate and oxycodone per pain clinic.  5. History of hepatitis  B. LFTs remain normal at this time. 6. Smoking: he now smokes about one half to one pack per day of cigarettes. He is not interested in quitting at this time.  7. Followup: every 4 months.  Next CT scan is due 07/2013 but sooner if he has concerning symptoms.

## 2012-12-09 ENCOUNTER — Telehealth: Payer: Self-pay | Admitting: Oncology

## 2012-12-09 NOTE — Telephone Encounter (Signed)
s.w. pt and arranged Aug lab and est..Marland Kitchenpt will pick up new schedule at nxt flush appt

## 2013-01-08 ENCOUNTER — Ambulatory Visit (HOSPITAL_BASED_OUTPATIENT_CLINIC_OR_DEPARTMENT_OTHER): Payer: Medicare Other

## 2013-01-08 VITALS — BP 126/76 | HR 94 | Temp 98.4°F

## 2013-01-08 DIAGNOSIS — Z452 Encounter for adjustment and management of vascular access device: Secondary | ICD-10-CM

## 2013-01-08 DIAGNOSIS — C8298 Follicular lymphoma, unspecified, lymph nodes of multiple sites: Secondary | ICD-10-CM

## 2013-01-08 DIAGNOSIS — C8290 Follicular lymphoma, unspecified, unspecified site: Secondary | ICD-10-CM

## 2013-01-08 MED ORDER — HEPARIN SOD (PORK) LOCK FLUSH 100 UNIT/ML IV SOLN
500.0000 [IU] | Freq: Once | INTRAVENOUS | Status: AC
  Administered 2013-01-08: 500 [IU] via INTRAVENOUS
  Filled 2013-01-08: qty 5

## 2013-01-08 MED ORDER — SODIUM CHLORIDE 0.9 % IJ SOLN
10.0000 mL | INTRAMUSCULAR | Status: DC | PRN
  Administered 2013-01-08: 10 mL via INTRAVENOUS
  Filled 2013-01-08: qty 10

## 2013-01-08 NOTE — Patient Instructions (Signed)
Call MD for problems or concerns 

## 2013-02-19 ENCOUNTER — Telehealth: Payer: Self-pay | Admitting: *Deleted

## 2013-02-19 ENCOUNTER — Telehealth: Payer: Self-pay | Admitting: Oncology

## 2013-02-19 NOTE — Telephone Encounter (Signed)
Patient called and moved his flush appt from today to next week.  JMW

## 2013-02-25 ENCOUNTER — Ambulatory Visit (HOSPITAL_BASED_OUTPATIENT_CLINIC_OR_DEPARTMENT_OTHER): Payer: Medicare Other

## 2013-02-25 VITALS — BP 123/85 | HR 83 | Temp 97.8°F | Resp 18

## 2013-02-25 DIAGNOSIS — C8290 Follicular lymphoma, unspecified, unspecified site: Secondary | ICD-10-CM

## 2013-02-25 DIAGNOSIS — C8299 Follicular lymphoma, unspecified, extranodal and solid organ sites: Secondary | ICD-10-CM

## 2013-02-25 DIAGNOSIS — Z452 Encounter for adjustment and management of vascular access device: Secondary | ICD-10-CM

## 2013-02-25 MED ORDER — HEPARIN SOD (PORK) LOCK FLUSH 100 UNIT/ML IV SOLN
500.0000 [IU] | Freq: Once | INTRAVENOUS | Status: AC
  Administered 2013-02-25: 500 [IU] via INTRAVENOUS
  Filled 2013-02-25: qty 5

## 2013-02-25 MED ORDER — SODIUM CHLORIDE 0.9 % IJ SOLN
10.0000 mL | INTRAMUSCULAR | Status: DC | PRN
  Administered 2013-02-25: 10 mL via INTRAVENOUS
  Filled 2013-02-25: qty 10

## 2013-02-25 NOTE — Patient Instructions (Signed)
Call MD for problems or concerns 

## 2013-02-25 NOTE — Progress Notes (Signed)
Patient complained of dry mouth related to his oral meds.  Biotene recommended

## 2013-04-09 ENCOUNTER — Ambulatory Visit: Payer: Self-pay | Admitting: Oncology

## 2013-04-09 ENCOUNTER — Other Ambulatory Visit: Payer: Self-pay | Admitting: Lab

## 2013-04-09 ENCOUNTER — Ambulatory Visit: Payer: Self-pay | Admitting: Hematology and Oncology

## 2013-05-14 ENCOUNTER — Ambulatory Visit (HOSPITAL_BASED_OUTPATIENT_CLINIC_OR_DEPARTMENT_OTHER): Payer: Medicare Other

## 2013-05-14 VITALS — BP 149/84 | HR 95 | Temp 98.7°F

## 2013-05-14 DIAGNOSIS — Z452 Encounter for adjustment and management of vascular access device: Secondary | ICD-10-CM

## 2013-05-14 DIAGNOSIS — C8590 Non-Hodgkin lymphoma, unspecified, unspecified site: Secondary | ICD-10-CM

## 2013-05-14 DIAGNOSIS — C8589 Other specified types of non-Hodgkin lymphoma, extranodal and solid organ sites: Secondary | ICD-10-CM

## 2013-05-14 MED ORDER — SODIUM CHLORIDE 0.9 % IJ SOLN
10.0000 mL | INTRAMUSCULAR | Status: DC | PRN
  Administered 2013-05-14: 10 mL via INTRAVENOUS
  Filled 2013-05-14: qty 10

## 2013-05-14 MED ORDER — HEPARIN SOD (PORK) LOCK FLUSH 100 UNIT/ML IV SOLN
500.0000 [IU] | Freq: Once | INTRAVENOUS | Status: AC
  Administered 2013-05-14: 500 [IU] via INTRAVENOUS
  Filled 2013-05-14: qty 5

## 2013-05-14 NOTE — Patient Instructions (Addendum)
Implanted Port Instructions  An implanted port is a central line that has a round shape and is placed under the skin. It is used for long-term IV (intravenous) access for:  · Medicine.  · Fluids.  · Liquid nutrition, such as TPN (total parenteral nutrition).  · Blood samples.  Ports can be placed:  · In the chest area just below the collarbone (this is the most common place.)  · In the arms.  · In the belly (abdomen) area.  · In the legs.  PARTS OF THE PORT  A port has 2 main parts:  · The reservoir. The reservoir is round, disc-shaped, and will be a small, raised area under your skin.  · The reservoir is the part where a needle is inserted (accessed) to either give medicines or to draw blood.  · The catheter. The catheter is a long, slender tube that extends from the reservoir. The catheter is placed into a large vein.  · Medicine that is inserted into the reservoir goes into the catheter and then into the vein.  INSERTION OF THE PORT  · The port is surgically placed in either an operating room or in a procedural area (interventional radiology).  · Medicine may be given to help you relax during the procedure.  · The skin where the port will be inserted is numbed (local anesthetic).  · 1 or 2 small cuts (incisions) will be made in the skin to insert the port.  · The port can be used after it has been inserted.  INCISION SITE CARE  · The incision site may have small adhesive strips on it. This helps keep the incision site closed. Sometimes, no adhesive strips are placed. Instead of adhesive strips, a special kind of surgical glue is used to keep the incision closed.  · If adhesive strips were placed on the incision sites, do not take them off. They will fall off on their own.  · The incision site may be sore for 1 to 2 days. Pain medicine can help.  · Do not get the incision site wet. Bathe or shower as directed by your caregiver.  · The incision site should heal in 5 to 7 days. A small scar may form after the  incision has healed.  ACCESSING THE PORT  Special steps must be taken to access the port:  · Before the port is accessed, a numbing cream can be placed on the skin. This helps numb the skin over the port site.  · A sterile technique is used to access the port.  · The port is accessed with a needle. Only "non-coring" port needles should be used to access the port. Once the port is accessed, a blood return should be checked. This helps ensure the port is in the vein and is not clogged (clotted).  · If your caregiver believes your port should remain accessed, a clear (transparent) bandage will be placed over the needle site. The bandage and needle will need to be changed every week or as directed by your caregiver.  · Keep the bandage covering the needle clean and dry. Do not get it wet. Follow your caregiver's instructions on how to take a shower or bath when the port is accessed.  · If your port does not need to stay accessed, no bandage is needed over the port.  FLUSHING THE PORT  Flushing the port keeps it from getting clogged. How often the port is flushed depends on:  · If a   constant infusion is running. If a constant infusion is running, the port may not need to be flushed.  · If intermittent medicines are given.  · If the port is not being used.  For intermittent medicines:  · The port will need to be flushed:  · After medicines have been given.  · After blood has been drawn.  · As part of routine maintenance.  · A port is normally flushed with:  · Normal saline.  · Heparin.  · Follow your caregiver's advice on how often, how much, and the type of flush to use on your port.  IMPORTANT PORT INFORMATION  · Tell your caregiver if you are allergic to heparin.  · After your port is placed, you will get a manufacturer's information card. The card has information about your port. Keep this card with you at all times.  · There are many types of ports available. Know what kind of port you have.  · In case of an  emergency, it may be helpful to wear a medical alert bracelet. This can help alert health care workers that you have a port.  · The port can stay in for as long as your caregiver believes it is necessary.  · When it is time for the port to come out, surgery will be done to remove it. The surgery will be similar to how the port was put in.  · If you are in the hospital or clinic:  · Your port will be taken care of and flushed by a nurse.  · If you are at home:  · A home health care nurse may give medicines and take care of the port.  · You or a family member can get special training and directions for giving medicine and taking care of the port at home.  SEEK IMMEDIATE MEDICAL CARE IF:   · Your port does not flush or you are unable to get a blood return.  · New drainage or pus is coming from the incision.  · A bad smell is coming from the incision site.  · You develop swelling or increased redness at the incision site.  · You develop increased swelling or pain at the port site.  · You develop swelling or pain in the surrounding skin near the port.  · You have an oral temperature above 102° F (38.9° C), not controlled by medicine.  MAKE SURE YOU:   · Understand these instructions.  · Will watch your condition.  · Will get help right away if you are not doing well or get worse.  Document Released: 08/20/2005 Document Revised: 11/12/2011 Document Reviewed: 11/11/2008  [employer]® Patient Information ©2014 [employer].

## 2013-07-10 ENCOUNTER — Telehealth: Payer: Self-pay | Admitting: Hematology and Oncology

## 2013-07-10 NOTE — Telephone Encounter (Signed)
ret call to pt and sche 12/5 lab ov and flush Former pt of Dr Gaylyn Rong so sch w Dr Bertis Ruddy Pt also stated needed Ct explained  pt wait until sees Dr so we can  get an order shh

## 2013-08-07 ENCOUNTER — Ambulatory Visit: Payer: Self-pay

## 2013-08-07 ENCOUNTER — Ambulatory Visit (HOSPITAL_BASED_OUTPATIENT_CLINIC_OR_DEPARTMENT_OTHER): Payer: Medicare Other | Admitting: Hematology and Oncology

## 2013-08-07 ENCOUNTER — Encounter: Payer: Self-pay | Admitting: Hematology and Oncology

## 2013-08-07 ENCOUNTER — Telehealth: Payer: Self-pay | Admitting: Hematology and Oncology

## 2013-08-07 ENCOUNTER — Other Ambulatory Visit (HOSPITAL_BASED_OUTPATIENT_CLINIC_OR_DEPARTMENT_OTHER): Payer: Medicare Other

## 2013-08-07 VITALS — BP 149/85 | HR 87 | Temp 98.5°F | Resp 18 | Ht 70.0 in | Wt 177.9 lb

## 2013-08-07 DIAGNOSIS — R109 Unspecified abdominal pain: Secondary | ICD-10-CM

## 2013-08-07 DIAGNOSIS — Z23 Encounter for immunization: Secondary | ICD-10-CM | POA: Insufficient documentation

## 2013-08-07 DIAGNOSIS — C8218 Follicular lymphoma grade II, lymph nodes of multiple sites: Secondary | ICD-10-CM

## 2013-08-07 DIAGNOSIS — F17200 Nicotine dependence, unspecified, uncomplicated: Secondary | ICD-10-CM

## 2013-08-07 DIAGNOSIS — Z800 Family history of malignant neoplasm of digestive organs: Secondary | ICD-10-CM

## 2013-08-07 DIAGNOSIS — C8298 Follicular lymphoma, unspecified, lymph nodes of multiple sites: Secondary | ICD-10-CM

## 2013-08-07 DIAGNOSIS — M810 Age-related osteoporosis without current pathological fracture: Secondary | ICD-10-CM

## 2013-08-07 HISTORY — DX: Encounter for immunization: Z23

## 2013-08-07 HISTORY — DX: Unspecified abdominal pain: R10.9

## 2013-08-07 LAB — COMPREHENSIVE METABOLIC PANEL (CC13)
ALT: 16 U/L (ref 0–55)
AST: 15 U/L (ref 5–34)
Albumin: 4.5 g/dL (ref 3.5–5.0)
Alkaline Phosphatase: 54 U/L (ref 40–150)
BUN: 5.7 mg/dL — ABNORMAL LOW (ref 7.0–26.0)
Glucose: 108 mg/dl (ref 70–140)
Potassium: 4 mEq/L (ref 3.5–5.1)
Total Bilirubin: 0.79 mg/dL (ref 0.20–1.20)

## 2013-08-07 LAB — LACTATE DEHYDROGENASE (CC13): LDH: 118 U/L — ABNORMAL LOW (ref 125–245)

## 2013-08-07 LAB — CBC WITH DIFFERENTIAL/PLATELET
BASO%: 0.6 % (ref 0.0–2.0)
Basophils Absolute: 0 10*3/uL (ref 0.0–0.1)
Eosinophils Absolute: 0.1 10*3/uL (ref 0.0–0.5)
MCH: 29.2 pg (ref 27.2–33.4)
MCHC: 34.1 g/dL (ref 32.0–36.0)
MCV: 85.4 fL (ref 79.3–98.0)
MONO#: 0.4 10*3/uL (ref 0.1–0.9)
MONO%: 6.1 % (ref 0.0–14.0)
NEUT#: 4.5 10*3/uL (ref 1.5–6.5)
NEUT%: 68.6 % (ref 39.0–75.0)
Platelets: 230 10*3/uL (ref 140–400)
RBC: 6.15 10*6/uL — ABNORMAL HIGH (ref 4.20–5.82)
RDW: 14.7 % — ABNORMAL HIGH (ref 11.0–14.6)
WBC: 6.5 10*3/uL (ref 4.0–10.3)
lymph#: 1.5 10*3/uL (ref 0.9–3.3)

## 2013-08-07 MED ORDER — HEPARIN SOD (PORK) LOCK FLUSH 100 UNIT/ML IV SOLN
500.0000 [IU] | Freq: Once | INTRAVENOUS | Status: AC
  Administered 2013-08-07: 500 [IU] via INTRAVENOUS
  Filled 2013-08-07: qty 5

## 2013-08-07 MED ORDER — SODIUM CHLORIDE 0.9 % IJ SOLN
10.0000 mL | INTRAMUSCULAR | Status: DC | PRN
  Administered 2013-08-07: 10 mL via INTRAVENOUS
  Filled 2013-08-07: qty 10

## 2013-08-07 MED ORDER — INFLUENZA VAC SPLIT QUAD 0.5 ML IM SUSP
0.5000 mL | Freq: Once | INTRAMUSCULAR | Status: AC
  Administered 2013-08-07: 0.5 mL via INTRAMUSCULAR
  Filled 2013-08-07: qty 0.5

## 2013-08-07 NOTE — Progress Notes (Signed)
[facility] OFFICE PROGRESS NOTE  Patient Care Team: Magnus Sinning. Rice, MD as PCP - General (Family Medicine) Serena Colonel, MD as Attending Physician (Otolaryngology) Theda Belfast, MD as Attending Physician (Gastroenterology) [name] as Referring Physician (Anesthesiology) Artis Delay, MD as Consulting Physician (Hematology and Oncology)  DIAGNOSIS: Follicular lymphoma, for further management  SUMMARY OF ONCOLOGIC HISTORY: This is a pleasant 57 year old gentleman with background history of follicular lymphoma. It was discovered after palpable lymphadenopathy. Biopsy showed grade 2 with grade 3 features. The patient was placed on RCHOP chemotherapy for 6 cycles between June 2011 to October 2011. He was then placed on maintainence rituximab every other month from December 2011 to November 2013. His last imaging study in November 2013 showed no evidence of disease..   INTERVAL HISTORY: Alvino Lechuga 57 y.o. male returns for further followup He complained of palpable masses in his skin. He also has intermittent abdomen no pain around the periumbilical region which is unrelated to his food or bowel habits. He has gained some weight since his doctor started him on testosterone injections. The patient smokes and is attempting to quit smoking. He denies any recent fever, chills, night sweats or abnormal weight loss  I have reviewed the past medical history, past surgical history, social history and family history with the patient and they are unchanged from previous note.  ALLERGIES:  has No Known Allergies.  MEDICATIONS:  Current Outpatient Prescriptions  Medication Sig Dispense Refill  . ACTONEL 150 MG tablet Take 150 mg by mouth Every month.      [name] buPROPion (WELLBUTRIN XL) 150 MG 24 hr tablet Take 150 mg by mouth daily.       . diphenhydramine-acetaminophen (TYLENOL PM) 25-500 MG TABS Take 1 tablet by mouth at bedtime as needed.        [name] escitalopram  (LEXAPRO) 10 MG tablet Take 5 mg by mouth Daily.      [name] lidocaine-prilocaine (EMLA) cream Apply 1 application topically as needed. Apply to porta cath site one hour prior to needle access  30 g  1  . morphine (MS CONTIN) 100 MG 12 hr tablet Take 100 mg by mouth 3 (three) times daily.        [name] oxyCODONE (OXY IR/ROXICODONE) 5 MG immediate release tablet Take 5 mg by mouth 4 (four) times daily.       [name] testosterone cypionate (DEPOTESTOTERONE CYPIONATE) 200 MG/ML injection Inject 200 mg into the muscle Every 14 days.       Current Facility-Administered Medications  Medication Dose Route Frequency Provider Last Rate Last Dose  . [START ON 08/08/2013] influenza vac split quadrivalent PF (FLUARIX) injection 0.5 mL  0.5 mL Intramuscular Tomorrow-1000 Shrinika Blatz, MD        REVIEW OF SYSTEMS:   Eyes: Denies blurriness of vision Ears, nose, mouth, throat, and face: Denies mucositis or sore throat Respiratory: Denies cough, dyspnea or wheezes Cardiovascular: Denies palpitation, chest discomfort or lower extremity swelling Skin: Denies abnormal skin rashes Lymphatics: Denies new lymphadenopathy or easy bruising Neurological:Denies numbness, tingling or new weaknesses Behavioral/Psych: Mood is stable, no new changes  All other systems were reviewed with the patient and are negative.  PHYSICAL EXAMINATION: ECOG PERFORMANCE STATUS: 0 - Asymptomatic  Filed Vitals:   08/07/13 0918  BP: 149/85  Pulse: 87  Temp: 98.5 F (36.9 C)  Resp: 18   Filed Weights   08/07/13 0918  Weight: 177 lb 14.4 oz (80.695 kg)    GENERAL:alert, no distress and  comfortable SKIN: skin color, texture, turgor are normal, no rashes or significant lesions EYES: normal, Conjunctiva are pink and non-injected, sclera clear OROPHARYNX:no exudate, no erythema and lips, buccal mucosa, and tongue normal  NECK: supple, thyroid normal size, non-tender, without nodularity LYMPH:  no palpable lymphadenopathy in the cervical,  axillary or inguinal LUNGS: clear to auscultation and percussion with normal breathing effort HEART: regular rate & rhythm and no murmurs and no lower extremity edema ABDOMEN:abdomen soft, non-tender and normal bowel sounds Musculoskeletal:no cyanosis of digits and no clubbing  NEURO: alert & oriented x 3 with fluent speech, no focal motor/sensory deficits  LABORATORY DATA:  I have reviewed the data as listed    Component Value Date/Time   NA 140 12/08/2012 1508   NA 142 03/24/2012 0904   NA 142 01/22/2012 0839   K 3.9 12/08/2012 1508   K 3.7 03/24/2012 0904   K 4.0 01/22/2012 0839   CL 99 12/08/2012 1508   CL 103 03/24/2012 0904   CL 98 01/22/2012 0839   CO2 32* 12/08/2012 1508   CO2 29 03/24/2012 0904   CO2 31 01/22/2012 0839   GLUCOSE 93 12/08/2012 1508   GLUCOSE 104* 03/24/2012 0904   GLUCOSE 89 01/22/2012 0839   BUN 9.5 12/08/2012 1508   BUN 6 03/24/2012 0904   BUN 9 01/22/2012 0839   CREATININE 1.1 12/08/2012 1508   CREATININE 0.74 03/24/2012 0904   CREATININE 0.79 02/27/2012 1538   CALCIUM 9.9 12/08/2012 1508   CALCIUM 9.1 03/24/2012 0904   CALCIUM 9.0 01/22/2012 0839   PROT 6.9 12/08/2012 1508   PROT 6.1 03/24/2012 0904   PROT 6.5 01/22/2012 0839   ALBUMIN 4.0 12/08/2012 1508   ALBUMIN 4.3 03/24/2012 0904   AST 18 12/08/2012 1508   AST 11 03/24/2012 0904   AST 20 01/22/2012 0839   ALT 15 12/08/2012 1508   ALT 10 03/24/2012 0904   ALT 28 01/22/2012 0839   ALKPHOS 53 12/08/2012 1508   ALKPHOS 53 03/24/2012 0904   ALKPHOS 64 01/22/2012 0839   BILITOT 0.75 12/08/2012 1508   BILITOT 0.5 03/24/2012 0904   BILITOT 0.60 01/22/2012 0839   GFRNONAA >60 03/28/2011 1125   GFRAA >60 03/28/2011 1125    No results found for this basename: SPEP, UPEP,  kappa and lambda light chains    Lab Results  Component Value Date   WBC 8.6 12/08/2012   NEUTROABS 6.5 12/08/2012   HGB 16.6 12/08/2012   HCT 50.2* 12/08/2012   MCV 83.2 12/08/2012   PLT 226 12/08/2012      Chemistry      Component Value Date/Time   NA 140 12/08/2012 1508    NA 142 03/24/2012 0904   NA 142 01/22/2012 0839   K 3.9 12/08/2012 1508   K 3.7 03/24/2012 0904   K 4.0 01/22/2012 0839   CL 99 12/08/2012 1508   CL 103 03/24/2012 0904   CL 98 01/22/2012 0839   CO2 32* 12/08/2012 1508   CO2 29 03/24/2012 0904   CO2 31 01/22/2012 0839   BUN 9.5 12/08/2012 1508   BUN 6 03/24/2012 0904   BUN 9 01/22/2012 0839   CREATININE 1.1 12/08/2012 1508   CREATININE 0.74 03/24/2012 0904   CREATININE 0.79 02/27/2012 1538      Component Value Date/Time   CALCIUM 9.9 12/08/2012 1508   CALCIUM 9.1 03/24/2012 0904   CALCIUM 9.0 01/22/2012 0839   ALKPHOS 53 12/08/2012 1508   ALKPHOS 53 03/24/2012 0904   ALKPHOS  64 01/22/2012 0839   AST 18 12/08/2012 1508   AST 11 03/24/2012 0904   AST 20 01/22/2012 0839   ALT 15 12/08/2012 1508   ALT 10 03/24/2012 0904   ALT 28 01/22/2012 0839   BILITOT 0.75 12/08/2012 1508   BILITOT 0.5 03/24/2012 0904   BILITOT 0.60 01/22/2012 0839      ASSESSMENT & PLAN:  #1 follicular lymphoma Due to new concerns for possible disease recurrence, I will order blood work and CT scan to evaluate. #2 smoking I provided nicotine cessation counseling. The patient is attempting to quit on his own #3 abdominal pain There is family history of colon cancer. I will start with some imaging study to rule out gross abnormalities. If it is unrevealing, we might have to read for him back to GI service. #4 osteoporosis The patient is on oral bisphosphonates, calcium with vitamin D. His primary doctor to started him on testosterone injection #5 preventive care We discussed the importance of preventive care and reviewed the vaccination programs. He does not have any prior allergic reactions to influenza vaccination. He agrees to proceed with influenza vaccination today and we will administer it today at the clinic.   Orders Placed This Encounter  Procedures  . CT Chest W Contrast    Standing Status: Future     Number of Occurrences:      Standing Expiration Date: 10/07/2014    Order  Specific Question:  Reason for Exam (SYMPTOM  OR DIAGNOSIS REQUIRED)    Answer:  smoker, lymphoma, r/o recurrence    Order Specific Question:  Preferred imaging location?    Answer:  [facility]  . CT Abdomen Pelvis W Contrast    Standing Status: Future     Number of Occurrences:      Standing Expiration Date: 11/07/2014    Order Specific Question:  Reason for Exam (SYMPTOM  OR DIAGNOSIS REQUIRED)    Answer:  lymphoma, new abdominal pain, concern for recurrence    Order Specific Question:  Preferred imaging location?    Answer:  [facility]   All questions were answered. The patient knows to call the clinic with any problems, questions or concerns. No barriers to learning was detected.    Hovanes Hymas, MD 08/07/2013 9:32 AM

## 2013-08-07 NOTE — Progress Notes (Signed)
Done by desk nurse

## 2013-08-07 NOTE — Telephone Encounter (Signed)
appts made per 12/5 POF Pt adv CT will call w appt Barium given AVS and CAL given shh

## 2013-08-19 ENCOUNTER — Ambulatory Visit (HOSPITAL_COMMUNITY)
Admission: RE | Admit: 2013-08-19 | Discharge: 2013-08-19 | Disposition: A | Discharge status: Home / Self Care | Payer: Medicare Other | Source: Ambulatory Visit | Attending: Hematology and Oncology | Admitting: Hematology and Oncology

## 2013-08-19 ENCOUNTER — Encounter (HOSPITAL_COMMUNITY): Payer: Self-pay

## 2013-08-19 DIAGNOSIS — B1910 Unspecified viral hepatitis B without hepatic coma: Secondary | ICD-10-CM | POA: Insufficient documentation

## 2013-08-19 DIAGNOSIS — K8020 Calculus of gallbladder without cholecystitis without obstruction: Secondary | ICD-10-CM | POA: Insufficient documentation

## 2013-08-19 DIAGNOSIS — M51379 Other intervertebral disc degeneration, lumbosacral region without mention of lumbar back pain or lower extremity pain: Secondary | ICD-10-CM | POA: Insufficient documentation

## 2013-08-19 DIAGNOSIS — I700 Atherosclerosis of aorta: Secondary | ICD-10-CM | POA: Insufficient documentation

## 2013-08-19 DIAGNOSIS — R109 Unspecified abdominal pain: Secondary | ICD-10-CM | POA: Insufficient documentation

## 2013-08-19 DIAGNOSIS — D739 Disease of spleen, unspecified: Secondary | ICD-10-CM | POA: Insufficient documentation

## 2013-08-19 DIAGNOSIS — C8218 Follicular lymphoma grade II, lymph nodes of multiple sites: Secondary | ICD-10-CM

## 2013-08-19 DIAGNOSIS — C8299 Follicular lymphoma, unspecified, extranodal and solid organ sites: Secondary | ICD-10-CM | POA: Insufficient documentation

## 2013-08-19 DIAGNOSIS — M5137 Other intervertebral disc degeneration, lumbosacral region: Secondary | ICD-10-CM | POA: Insufficient documentation

## 2013-08-19 DIAGNOSIS — M19019 Primary osteoarthritis, unspecified shoulder: Secondary | ICD-10-CM | POA: Insufficient documentation

## 2013-08-19 DIAGNOSIS — K838 Other specified diseases of biliary tract: Secondary | ICD-10-CM | POA: Insufficient documentation

## 2013-08-19 DIAGNOSIS — I771 Stricture of artery: Secondary | ICD-10-CM | POA: Insufficient documentation

## 2013-08-19 MED ORDER — IOHEXOL 300 MG/ML  SOLN
100.0000 mL | Freq: Once | INTRAMUSCULAR | Status: AC | PRN
  Administered 2013-08-19: 100 mL via INTRAVENOUS

## 2013-08-24 ENCOUNTER — Ambulatory Visit (HOSPITAL_BASED_OUTPATIENT_CLINIC_OR_DEPARTMENT_OTHER): Payer: Medicare Other | Admitting: Hematology and Oncology

## 2013-08-24 ENCOUNTER — Telehealth: Payer: Self-pay | Admitting: Hematology and Oncology

## 2013-08-24 VITALS — BP 151/92 | HR 88 | Temp 98.1°F | Resp 18 | Ht 70.0 in | Wt 177.5 lb

## 2013-08-24 DIAGNOSIS — C8298 Follicular lymphoma, unspecified, lymph nodes of multiple sites: Secondary | ICD-10-CM

## 2013-08-24 DIAGNOSIS — D751 Secondary polycythemia: Secondary | ICD-10-CM

## 2013-08-24 DIAGNOSIS — E291 Testicular hypofunction: Secondary | ICD-10-CM

## 2013-08-24 DIAGNOSIS — R109 Unspecified abdominal pain: Secondary | ICD-10-CM

## 2013-08-24 DIAGNOSIS — C8218 Follicular lymphoma grade II, lymph nodes of multiple sites: Secondary | ICD-10-CM

## 2013-08-24 DIAGNOSIS — F17200 Nicotine dependence, unspecified, uncomplicated: Secondary | ICD-10-CM

## 2013-08-24 NOTE — Progress Notes (Signed)
[facility] OFFICE PROGRESS NOTE  Patient Care Team: Magnus Sinning. Rice, MD as PCP - General (Family Medicine) Serena Colonel, MD as Attending Physician (Otolaryngology) Theda Belfast, MD as Attending Physician (Gastroenterology) [name] as Referring Physician (Anesthesiology) Artis Delay, MD as Consulting Physician (Hematology and Oncology)  DIAGNOSIS: Follicular lymphoma, for further management  SUMMARY OF ONCOLOGIC HISTORY: This is a pleasant 57 year old gentleman with background history of follicular lymphoma. It was discovered after palpable lymphadenopathy. Biopsy showed grade 2 with grade 3 features. The patient was placed on RCHOP chemotherapy for 6 cycles between June 2011 to October 2011. He was then placed on maintainence rituximab every other month from December 2011 to November 2013. His last imaging study in December 2014 showed no evidence of disease..   INTERVAL HISTORY: Edwin Yu 57 y.o. male returns for further followup. He still has intermittent abdominal pain in the epigastric region. He continued to smoke and that able to quit smoking. The patient had mild constipation from pain medicine.  I have reviewed the past medical history, past surgical history, social history and family history with the patient and they are unchanged from previous note.  ALLERGIES:  has No Known Allergies.  MEDICATIONS:  Current Outpatient Prescriptions  Medication Sig Dispense Refill  . ACTONEL 150 MG tablet Take 150 mg by mouth Every month.      [name] buPROPion (WELLBUTRIN XL) 150 MG 24 hr tablet Take 150 mg by mouth daily.       . diphenhydramine-acetaminophen (TYLENOL PM) 25-500 MG TABS Take 1 tablet by mouth at bedtime as needed.        [name] escitalopram (LEXAPRO) 10 MG tablet Take 5 mg by mouth Daily.      [name] lidocaine-prilocaine (EMLA) cream Apply 1 application topically as needed. Apply to porta cath site one hour prior to needle access  30 g  1  . morphine  (MS CONTIN) 100 MG 12 hr tablet Take 100 mg by mouth 3 (three) times daily.        [name] oxyCODONE (OXY IR/ROXICODONE) 5 MG immediate release tablet Take 5 mg by mouth 4 (four) times daily.       [name] testosterone cypionate (DEPOTESTOTERONE CYPIONATE) 200 MG/ML injection Inject 200 mg into the muscle Every 14 days.       No current facility-administered medications for this visit.    REVIEW OF SYSTEMS:   Constitutional: Denies fevers, chills or abnormal weight loss Behavioral/Psych: Mood is stable, no new changes  All other systems were reviewed with the patient and are negative.  PHYSICAL EXAMINATION: ECOG PERFORMANCE STATUS: 1 - Symptomatic but completely ambulatory  Filed Vitals:   08/24/13 1329  BP: 151/92  Pulse: 88  Temp: 98.1 F (36.7 C)  Resp: 18   Filed Weights   08/24/13 1329  Weight: 177 lb 8 oz (80.513 kg)    GENERAL:alert, no distress and comfortable SKIN: skin color, texture, turgor are normal, no rashes or significant lesions Musculoskeletal:no cyanosis of digits and no clubbing  NEURO: alert & oriented x 3 with fluent speech, no focal motor/sensory deficits  LABORATORY DATA:  I have reviewed the data as listed    Component Value Date/Time   NA 142 08/07/2013 0902   NA 142 03/24/2012 0904   NA 142 01/22/2012 0839   K 4.0 08/07/2013 0902   K 3.7 03/24/2012 0904   K 4.0 01/22/2012 0839   CL 99 12/08/2012 1508   CL 103 03/24/2012 0904   CL 98 01/22/2012  0839   CO2 29 08/07/2013 0902   CO2 29 03/24/2012 0904   CO2 31 01/22/2012 0839   GLUCOSE 108 08/07/2013 0902   GLUCOSE 93 12/08/2012 1508   GLUCOSE 104* 03/24/2012 0904   GLUCOSE 89 01/22/2012 0839   BUN 5.7* 08/07/2013 0902   BUN 6 03/24/2012 0904   BUN 9 01/22/2012 0839   CREATININE 0.9 08/07/2013 0902   CREATININE 0.74 03/24/2012 0904   CREATININE 0.79 02/27/2012 1538   CALCIUM 9.7 08/07/2013 0902   CALCIUM 9.1 03/24/2012 0904   CALCIUM 9.0 01/22/2012 0839   PROT 7.1 08/07/2013 0902   PROT 6.1 03/24/2012 0904   PROT 6.5  01/22/2012 0839   ALBUMIN 4.5 08/07/2013 0902   ALBUMIN 4.3 03/24/2012 0904   AST 15 08/07/2013 0902   AST 11 03/24/2012 0904   AST 20 01/22/2012 0839   ALT 16 08/07/2013 0902   ALT 10 03/24/2012 0904   ALT 28 01/22/2012 0839   ALKPHOS 54 08/07/2013 0902   ALKPHOS 53 03/24/2012 0904   ALKPHOS 64 01/22/2012 0839   BILITOT 0.79 08/07/2013 0902   BILITOT 0.5 03/24/2012 0904   BILITOT 0.60 01/22/2012 0839   GFRNONAA >60 03/28/2011 1125   GFRAA >60 03/28/2011 1125    No results found for this basename: SPEP,  UPEP,   kappa and lambda light chains    Lab Results  Component Value Date   WBC 6.5 08/07/2013   NEUTROABS 4.5 08/07/2013   HGB 17.9* 08/07/2013   HCT 52.6* 08/07/2013   MCV 85.4 08/07/2013   PLT 230 08/07/2013      Chemistry      Component Value Date/Time   NA 142 08/07/2013 0902   NA 142 03/24/2012 0904   NA 142 01/22/2012 0839   K 4.0 08/07/2013 0902   K 3.7 03/24/2012 0904   K 4.0 01/22/2012 0839   CL 99 12/08/2012 1508   CL 103 03/24/2012 0904   CL 98 01/22/2012 0839   CO2 29 08/07/2013 0902   CO2 29 03/24/2012 0904   CO2 31 01/22/2012 0839   BUN 5.7* 08/07/2013 0902   BUN 6 03/24/2012 0904   BUN 9 01/22/2012 0839   CREATININE 0.9 08/07/2013 0902   CREATININE 0.74 03/24/2012 0904   CREATININE 0.79 02/27/2012 1538      Component Value Date/Time   CALCIUM 9.7 08/07/2013 0902   CALCIUM 9.1 03/24/2012 0904   CALCIUM 9.0 01/22/2012 0839   ALKPHOS 54 08/07/2013 0902   ALKPHOS 53 03/24/2012 0904   ALKPHOS 64 01/22/2012 0839   AST 15 08/07/2013 0902   AST 11 03/24/2012 0904   AST 20 01/22/2012 0839   ALT 16 08/07/2013 0902   ALT 10 03/24/2012 0904   ALT 28 01/22/2012 0839   BILITOT 0.79 08/07/2013 0902   BILITOT 0.5 03/24/2012 0904   BILITOT 0.60 01/22/2012 0839       RADIOGRAPHIC STUDIES: I reviewed his CT scan with the patient which show possible gallstone disease I have personally reviewed the radiological images as listed and agreed with the findings in the report.  ASSESSMENT & PLAN:   #1  follicular lymphoma His evidence of disease recurrence. I recommend port removal. I am referring him to his surgeon for further evaluation of his abdominal pain and for port removal I will see him back in 6 months with history, physical examination and blood work. #2 abdominal pain He could be due to gallstone disease. I will refer him to Gen. Surgery for further evaluation #  3 significant erythrocytosis This is due to his smoking and testosterone injection. The patient is at risk of blood clots. I recommend he consult his primary doctor about reducing the frequency of testosterone injection and to start him on aspirin 81 mg to reduce risk of blood clot #4 hypogonadism He is on testosterone replacement therapy but his treatment is causing significant erythrocytosis. I would defer to his primary care provider for dosage adjustment. Orders Placed This Encounter  Procedures  . CBC with Differential    Standing Status: Future     Number of Occurrences:      Standing Expiration Date: 05/16/2014  . Comprehensive metabolic panel    Standing Status: Future     Number of Occurrences:      Standing Expiration Date: 08/24/2014  . Lactate dehydrogenase    Standing Status: Future     Number of Occurrences:      Standing Expiration Date: 08/24/2014  . Ambulatory referral to General Surgery    Referral Priority:  Routine    Referral Type:  Surgical    Referral Reason:  Specialty Services Required    Referred to Provider:  Ernestene Mention, MD    Requested Specialty:  General Surgery    Number of Visits Requested:  1   All questions were answered. The patient knows to call the clinic with any problems, questions or concerns. No barriers to learning was detected. I spent 25 minutes counseling the patient face to face. The total time spent in the appointment was 40 minutes and more than 50% was on counseling and review of test results     [facility], Sady Monaco, MD 08/24/2013 2:06 PM

## 2013-08-24 NOTE — Telephone Encounter (Signed)
gv pt appt schedule for june and appt for 09/14/13 w/dr ingram @ [facility]. per pt he will s/w dr Derrell Lolling re port removal and give [place] a call if he needs more flush appts.

## 2013-09-14 ENCOUNTER — Encounter (INDEPENDENT_AMBULATORY_CARE_PROVIDER_SITE_OTHER): Payer: Self-pay | Admitting: General Surgery

## 2013-09-14 ENCOUNTER — Ambulatory Visit (INDEPENDENT_AMBULATORY_CARE_PROVIDER_SITE_OTHER): Payer: Medicare Other | Admitting: General Surgery

## 2013-09-14 ENCOUNTER — Telehealth (INDEPENDENT_AMBULATORY_CARE_PROVIDER_SITE_OTHER): Payer: Self-pay | Admitting: *Deleted

## 2013-09-14 VITALS — BP 140/84 | HR 76 | Temp 98.0°F | Resp 14 | Ht 70.0 in | Wt 181.8 lb

## 2013-09-14 DIAGNOSIS — Z95828 Presence of other vascular implants and grafts: Secondary | ICD-10-CM

## 2013-09-14 DIAGNOSIS — Z9889 Other specified postprocedural states: Secondary | ICD-10-CM

## 2013-09-14 DIAGNOSIS — K8020 Calculus of gallbladder without cholecystitis without obstruction: Secondary | ICD-10-CM | POA: Insufficient documentation

## 2013-09-14 NOTE — Patient Instructions (Signed)
You have gallstones, and so you clearly have a diseased gallbladder.  Your liver function tests are normal and her liver looks normal on the CT scan. There is no evidence of lymphoma.  I am not sure that your abdominal pain is due to your gallbladder, however.  You will be scheduled for a CCK stimulated hepatobiliary scan to see whether the gallbladder is functioning or not.  Return to see Dr. Dalbert Batman after this x-ray test  and we will decide whether you need a gallbladder operation or not. We will also schedule for removal of the Port-A-Cath.

## 2013-09-14 NOTE — Progress Notes (Signed)
Patient ID: Edwin Yu, male   DOB: [birth year], 58 y.o.   MRN: [id number]  Chief Complaint  Patient presents with  . New Evaluation    ecal coectomy    HPI Edwin Yu is a 58 y.o. male.  He is referred back to me by Dr. Alvy Bimler that [facility] for consideration of port catheter removal and for evaluation of abdominal pain and gallstones.   The patient states that he has periumbilical and central abdominal pain intermittently. This is more of a tenderness in a spontaneous pain. It is intermittent. It is not related to meals. There is no nausea or vomiting. He has normal bowel movements. Weight is stable. In fact, he has been on testosterone is and is gained about 6 or 7 pounds and feels pretty good.  Past history is significant for lymphoma. I put a Port-A-Cath in him after biopsying a right axillary lymph node. He has had R. CHOP chemotherapy in 2011 and then was on maintenance rituximab through November 2013. Recent CT chest at her pelvis showed no evidence of disease.  Normal colonoscopy by Dr. Benson Norway, 2012. History HBV positive blood test. Has never been jaundiced.  The cause of his abdominal pain is unclear, although his CT scan does show gallstones. Dr. Simeon Craft such as requested that his Port-A-Cath be removed and that I have valuated his abdominal pain and gallstones. He is in no distress today HPI  Past Medical History  Diagnosis Date  . 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  [name] Chronic lower back pain   . Hepatitis B antibody positive 2011    on Lamivudine until finish of maintenance Rituxan in 08/2012.  [name] Cancer     nhl  . Osteoporosis   . Abdominal pain, unspecified site 08/07/2013  . Need for prophylactic vaccination and inoculation against influenza 08/07/2013    Past Surgical History   Procedure Laterality Date  . Dental surgery      had all teeth removed  . Portacath placement    . Lymph node biopsy    . Wrist surgery      broke right    Family History  Problem Relation Age of Onset  . Cancer Father     colon cancer    Social History History  Substance Use Topics  . Smoking status: Current Every Day Smoker -- 0.50 packs/day for 30 years    Types: Cigarettes  . Smokeless tobacco: Never Used     Comment: cutting down gradually  . Alcohol Use: No    No Known Allergies  Current Outpatient Prescriptions  Medication Sig Dispense Refill  . alendronate (FOSAMAX) 70 MG tablet Take 70 mg by mouth once a week. Take with a full glass of water on an empty stomach.      [name] buPROPion (WELLBUTRIN XL) 150 MG 24 hr tablet Take 150 mg by mouth daily.       . diphenhydramine-acetaminophen (TYLENOL PM) 25-500 MG TABS Take 1 tablet by mouth at bedtime as needed.        [name] escitalopram (LEXAPRO) 10 MG tablet Take 5 mg by mouth Daily.      [name] lidocaine-prilocaine (EMLA) cream Apply 1 application topically as needed. Apply to porta cath site one hour prior to needle access  30 g  1  .  morphine (MS CONTIN) 100 MG 12 hr tablet Take 100 mg by mouth 3 (three) times daily.        [name] oxyCODONE (OXY IR/ROXICODONE) 5 MG immediate release tablet Take 5 mg by mouth 4 (four) times daily.       [name] testosterone cypionate (DEPOTESTOTERONE CYPIONATE) 200 MG/ML injection Inject 200 mg into the muscle Every 14 days.       No current facility-administered medications for this visit.    Review of Systems Review of Systems  Constitutional: Negative for fever, chills and unexpected weight change.  HENT: Negative for congestion, hearing loss, sore throat, trouble swallowing and voice change.   Eyes: Negative for visual disturbance.  Respiratory: Negative for cough and wheezing.   Cardiovascular: Negative for chest pain, palpitations and leg swelling.  Gastrointestinal: Positive for abdominal pain.  Negative for nausea, vomiting, diarrhea, constipation, blood in stool, abdominal distention, anal bleeding and rectal pain.  Genitourinary: Negative for hematuria and difficulty urinating.  Musculoskeletal: Negative for arthralgias.  Skin: Negative for rash and wound.  Neurological: Negative for seizures, syncope, weakness and headaches.  Hematological: Negative for adenopathy. Does not bruise/bleed easily.  Psychiatric/Behavioral: Negative for confusion. The patient is nervous/anxious.     Blood pressure 140/84, pulse 76, temperature 98 F (36.7 C), temperature source Temporal, resp. rate 14, height 5\' 10"  (1.778 m), weight 181 lb 12.8 oz (82.464 kg).  Physical Exam Physical Exam  Constitutional: He is oriented to person, place, and time. He appears well-developed and well-nourished. No distress.  HENT:  Head: Normocephalic.  Nose: Nose normal.  Mouth/Throat: No oropharyngeal exudate.  Eyes: Conjunctivae and EOM are normal. Pupils are equal, round, and reactive to light. Right eye exhibits no discharge. Left eye exhibits no discharge. No scleral icterus.  Neck: Normal range of motion. Neck supple. No JVD present. No tracheal deviation present. No thyromegaly present.  Cardiovascular: Normal rate, regular rhythm, normal heart sounds and intact distal pulses.   No murmur heard. Pulmonary/Chest: Effort normal and breath sounds normal. No stridor. No respiratory distress. He has no wheezes. He has no rales. He exhibits no tenderness.  Port right infraclavicular area with catheter going up over  clavicle into right internal jugular vein. Well healed.  Abdominal: Soft. Bowel sounds are normal. He exhibits no distension and no mass. There is no tenderness. There is no rebound and no guarding.  Musculoskeletal: Normal range of motion. He exhibits no edema and no tenderness.  Lymphadenopathy:    He has no cervical adenopathy.  Neurological: He is alert and oriented to person, place, and time. He  has normal reflexes. Coordination normal.  Skin: Skin is warm and dry. No rash noted. He is not diaphoretic. No erythema. No pallor.  No cervical, axillary, or inguinal adenopathy.  Psychiatric: He has a normal mood and affect. His behavior is normal. Judgment and thought content normal.  Very pleasant. Oriented. Cooperative. Slightly hyperactive.    Data Reviewed Cancer center office notes. Lab work. CT scans.  Assessment    Abdominal pain and gallstones. His pain is not exactly typical for biliary colic. I am not sure that he would benefit from cholecystectomy at this time  Desires Port-A-Cath removal  History of follicular lymphoma, currently in remission  History positive HBV blood test  Negative colonoscopy, 2012     Plan    Schedule for CCK stimulated hepatobiliary scan.  Return to see me in 2-3 weeks. We will decide at that time whether to simply proceed with Port-A-Cath removal under  local anesthesia, or whether he might benefit from cholecystectomy at the same time. He understands all of these issues well and is in full agreement.  I'll see him in 2-3 weeks.        [name]. Dalbert Batman, M.D., [facility] General and Minimally invasive Surgery Breast and Colorectal Surgery Office:   [phone] Pager:   [phone]  09/14/2013, 2:55 PM

## 2013-09-14 NOTE — Telephone Encounter (Signed)
I spoke with pts wife Velva Harman and informed her of pts appt for his HIDA scan at [facility] on 09/23/13 with an arrival time of 12:45pm.  I instructed her to make sure he is NPO 6 hours prior to scan and to take no opiate based medications the morning of his appt.

## 2013-09-23 ENCOUNTER — Other Ambulatory Visit (INDEPENDENT_AMBULATORY_CARE_PROVIDER_SITE_OTHER): Payer: Self-pay | Admitting: General Surgery

## 2013-09-23 ENCOUNTER — Ambulatory Visit (HOSPITAL_COMMUNITY)
Admission: RE | Admit: 2013-09-23 | Discharge: 2013-09-23 | Disposition: A | Discharge status: Home / Self Care | Payer: Medicare Other | Source: Ambulatory Visit | Attending: General Surgery | Admitting: General Surgery

## 2013-09-23 DIAGNOSIS — R932 Abnormal findings on diagnostic imaging of liver and biliary tract: Secondary | ICD-10-CM | POA: Insufficient documentation

## 2013-09-23 DIAGNOSIS — K8020 Calculus of gallbladder without cholecystitis without obstruction: Secondary | ICD-10-CM

## 2013-09-23 DIAGNOSIS — R109 Unspecified abdominal pain: Secondary | ICD-10-CM | POA: Insufficient documentation

## 2013-09-23 MED ORDER — MORPHINE SULFATE 4 MG/ML IJ SOLN
3.3000 mg | Freq: Once | INTRAMUSCULAR | Status: AC
  Administered 2013-09-23: 3.3 mg via INTRAVENOUS

## 2013-09-23 MED ORDER — TECHNETIUM TC 99M MEBROFENIN IV KIT
5.4000 | PACK | Freq: Once | INTRAVENOUS | Status: AC | PRN
  Administered 2013-09-23: 5 via INTRAVENOUS

## 2013-09-23 MED ORDER — MORPHINE SULFATE 4 MG/ML IJ SOLN
INTRAMUSCULAR | Status: AC
  Filled 2013-09-23: qty 1

## 2013-09-25 ENCOUNTER — Telehealth (INDEPENDENT_AMBULATORY_CARE_PROVIDER_SITE_OTHER): Payer: Self-pay

## 2013-09-25 NOTE — Telephone Encounter (Signed)
Message copied by Gweneth Fritter on Fri Sep 25, 2013 12:35 PM ------      Message from: Adin Hector      Created: Thu Sep 24, 2013  4:23 PM       Call radiology reports to patient. Tell him that abnormal HIDA scan strongly suggests gallbladder disease. Will discuss further at next visit.            hmi ------

## 2013-09-25 NOTE — Telephone Encounter (Signed)
Called pt and notified him of the results.  Advised we will discuss at next visit.  I confirmed his appointment with him on 10/05/13 at 10:30.

## 2013-10-05 ENCOUNTER — Ambulatory Visit (INDEPENDENT_AMBULATORY_CARE_PROVIDER_SITE_OTHER): Payer: Medicare Other | Admitting: General Surgery

## 2013-10-05 ENCOUNTER — Encounter (INDEPENDENT_AMBULATORY_CARE_PROVIDER_SITE_OTHER): Payer: Self-pay

## 2013-10-05 ENCOUNTER — Encounter (INDEPENDENT_AMBULATORY_CARE_PROVIDER_SITE_OTHER): Payer: Self-pay | Admitting: General Surgery

## 2013-10-05 VITALS — BP 130/86 | HR 88 | Temp 98.5°F | Resp 15 | Ht 70.0 in | Wt 179.8 lb

## 2013-10-05 DIAGNOSIS — C8590 Non-Hodgkin lymphoma, unspecified, unspecified site: Secondary | ICD-10-CM

## 2013-10-05 DIAGNOSIS — Z95828 Presence of other vascular implants and grafts: Secondary | ICD-10-CM

## 2013-10-05 DIAGNOSIS — K8020 Calculus of gallbladder without cholecystitis without obstruction: Secondary | ICD-10-CM

## 2013-10-05 DIAGNOSIS — C8589 Other specified types of non-Hodgkin lymphoma, extranodal and solid organ sites: Secondary | ICD-10-CM

## 2013-10-05 DIAGNOSIS — Z9889 Other specified postprocedural states: Secondary | ICD-10-CM

## 2013-10-05 NOTE — Patient Instructions (Signed)
The biliary scan shows that your gallbladder is not functioning at all. This makes it much more likely than that your episodes of abdominal pain are due to your gallstones.  You will be scheduled for surgery to remove the Port-A-Cath and to remove your gallbladder.    Laparoscopic Cholecystectomy Laparoscopic cholecystectomy is surgery to remove the gallbladder. The gallbladder is located in the upper right part of the abdomen, behind the liver. It is a storage sac for bile produced in the liver. Bile aids in the digestion and absorption of fats. Cholecystectomy is often done for inflammation of the gallbladder (cholecystitis). This condition is usually caused by a buildup of gallstones (cholelithiasis) in your gallbladder. Gallstones can block the flow of bile, resulting in inflammation and pain. In severe cases, emergency surgery may be required. When emergency surgery is not required, you will have time to prepare for the procedure. Laparoscopic surgery is an alternative to open surgery. Laparoscopic surgery has a shorter recovery time. Your common bile duct may also need to be examined during the procedure. If stones are found in the common bile duct, they may be removed. LET [facility] CARE PROVIDER KNOW ABOUT:  Any allergies you have.  All medicines you are taking, including vitamins, herbs, eye drops, creams, and over-the-counter medicines.  Previous problems you or members of your family have had with the use of anesthetics.  Any blood disorders you have.  Previous surgeries you have had.  Medical conditions you have. RISKS AND COMPLICATIONS Generally, this is a safe procedure. However, as with any procedure, complications can occur. Possible complications include:  Infection.  Damage to the common bile duct, nerves, arteries, veins, or other internal organs such as the stomach, liver, or intestines.  Bleeding.  A stone may remain in the common bile duct.  A bile leak from  the cyst duct that is clipped when your gallbladder is removed.  The need to convert to open surgery, which requires a larger incision in the abdomen. This may be necessary if your surgeon thinks it is not safe to continue with a laparoscopic procedure. BEFORE THE PROCEDURE  Ask your health care provider about changing or stopping any regular medicines. You will need to stop taking aspirin or blood thinners at least 5 days prior to surgery.  Do not eat or drink anything after midnight the night before surgery.  Let your health care provider know if you develop a cold or other infectious problem before surgery. PROCEDURE   You will be given medicine to make you sleep through the procedure (general anesthetic). A breathing tube will be placed in your mouth.  When you are asleep, your surgeon will make several small cuts (incisions) in your abdomen.  A thin, lighted tube with a tiny camera on the end (laparoscope) is inserted through one of the small incisions. The camera on the laparoscope sends a picture to a TV screen in the operating room. This gives the surgeon a good view inside your abdomen.  A gas will be pumped into your abdomen. This expands your abdomen so that the surgeon has more room to perform the surgery.  Other tools needed for the procedure are inserted through the other incisions. The gallbladder is removed through one of the incisions.  After the removal of your gallbladder, the incisions will be closed with stitches, staples, or skin glue. AFTER THE PROCEDURE  You will be taken to a recovery area where your progress will be checked often.  You may be  allowed to go home the same day if your pain is controlled and you can tolerate liquids. Document Released: 08/20/2005 Document Revised: 06/10/2013 Document Reviewed: 04/01/2013 [facility] Patient Information 2014 [place].

## 2013-10-05 NOTE — Progress Notes (Signed)
Patient ID: Edwin Yu, male   DOB: 1956/01/03, 58 y.o.   MRN: [id number]  Chief Complaint  Patient presents with  . Routine Post Op    discuss hida scan    HPI [name] is a 58 y.o. male.  He returns for further discussion about management and treatment of his Port-A-Cath removal and gallstones.  He states that he intermittently has right upper quadrant pain now. His hepatobiliary scan shows nonvisualization of the gallbladder, even after morphine. He had no symptoms during the biliary scan.  He states that the Port-A-Cath bothers him more. Somewhat tender. He'sgained 10 pounds of muscle weight since she's been taking testosterone.  Initial history is summarized as follows: He was referred back to me by Dr. Alvy Bimler at the [facility] for consideration of port catheter removal and for evaluation of abdominal pain and gallstones.  The patient states that he has periumbilical and central abdominal pain intermittently. This is more of a tenderness than a spontaneous pain. It is intermittent. It is not related to meals. There is no nausea or vomiting. He has normal bowel movements. Weight is stable. In fact, he has been on testosterone is and is gained about 6 or 7 pounds and feels pretty good.  Past history is significant for lymphoma. I put a Port-A-Cath in him after biopsying a right axillary lymph node. He has had  CHOP chemotherapy in 2011 and then was on maintenance rituximab through November 2013. Recent CT chest at her pelvis showed no evidence of disease.  Normal colonoscopy by Dr. Benson Norway, 2012. History HBV positive blood test. Has never been jaundiced.  HPI  Past Medical History  Diagnosis Date  . 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  [name] Chronic lower back pain   . Hepatitis B antibody positive 2011    on Lamivudine  until finish of maintenance Rituxan in 08/2012.  [name] Cancer     nhl  . Osteoporosis   . Abdominal pain, unspecified site 08/07/2013  . Need for prophylactic vaccination and inoculation against influenza 08/07/2013    Past Surgical History  Procedure Laterality Date  . Dental surgery      had all teeth removed  . Portacath placement    . Lymph node biopsy    . Wrist surgery      broke right    Family History  Problem Relation Age of Onset  . Cancer Father     colon cancer    Social History History  Substance Use Topics  . Smoking status: Current Every Day Smoker -- 0.50 packs/day for 30 years    Types: Cigarettes  . Smokeless tobacco: Never Used     Comment: cutting down gradually  . Alcohol Use: No    No Known Allergies  Current Outpatient Prescriptions  Medication Sig Dispense Refill  . alendronate (FOSAMAX) 70 MG tablet Take 70 mg by mouth once a week. Take with a full glass of water on an empty stomach.      [name] buPROPion (WELLBUTRIN XL) 150 MG 24 hr tablet Take 150 mg by mouth daily.       . diphenhydramine-acetaminophen (TYLENOL PM) 25-500 MG TABS Take 1 tablet by mouth at bedtime as needed.        [name] escitalopram (LEXAPRO) 10 MG tablet Take 5 mg by mouth Daily.      [name]  lidocaine-prilocaine (EMLA) cream Apply 1 application topically as needed. Apply to porta cath site one hour prior to needle access  30 g  1  . morphine (MS CONTIN) 100 MG 12 hr tablet Take 100 mg by mouth 3 (three) times daily.        [name] oxyCODONE (OXY IR/ROXICODONE) 5 MG immediate release tablet Take 5 mg by mouth 4 (four) times daily.       [name] testosterone cypionate (DEPOTESTOTERONE CYPIONATE) 200 MG/ML injection Inject 200 mg into the muscle Every 14 days.       No current facility-administered medications for this visit.    Review of Systems Review of Systems  Constitutional: Negative for fever, chills and unexpected weight change.  HENT: Negative for congestion, hearing loss, sore throat, trouble  swallowing and voice change.   Eyes: Negative for visual disturbance.  Respiratory: Negative for cough and wheezing.   Cardiovascular: Negative for chest pain, palpitations and leg swelling.  Gastrointestinal: Positive for abdominal pain. Negative for nausea, vomiting, diarrhea, constipation, blood in stool, abdominal distention, anal bleeding and rectal pain.  Genitourinary: Negative for hematuria and difficulty urinating.  Musculoskeletal: Negative for arthralgias.  Skin: Negative for rash and wound.  Neurological: Negative for seizures, syncope, weakness and headaches.  Hematological: Negative for adenopathy. Does not bruise/bleed easily.  Psychiatric/Behavioral: Negative for confusion. The patient is nervous/anxious.     Blood pressure 130/86, pulse 88, temperature 98.5 F (36.9 C), temperature source Oral, resp. rate 15, height 5\' 10"  (1.778 m), weight 179 lb 12.8 oz (81.557 kg).  Physical Exam Physical Exam  Constitutional: He is oriented to person, place, and time. He appears well-developed and well-nourished. No distress.  HENT:  Head: Normocephalic.  Nose: Nose normal.  Mouth/Throat: No oropharyngeal exudate.  Eyes: Conjunctivae and EOM are normal. Pupils are equal, round, and reactive to light. Right eye exhibits no discharge. Left eye exhibits no discharge. No scleral icterus.  Neck: Normal range of motion. Neck supple. No JVD present. No tracheal deviation present. No thyromegaly present.  Cardiovascular: Normal rate, regular rhythm, normal heart sounds and intact distal pulses.   No murmur heard. Pulmonary/Chest: Effort normal and breath sounds normal. No stridor. No respiratory distress. He has no wheezes. He has no rales. He exhibits no tenderness.  Port in right infraclavicular area with catheter entering right internal jugular vein. Appears well healed.  Abdominal: Soft. Bowel sounds are normal. He exhibits no distension and no mass. There is tenderness. There is no  rebound and no guarding.  Today, he is a little tender in the right upper quadrant.  Musculoskeletal: Normal range of motion. He exhibits no edema and no tenderness.  Lymphadenopathy:    He has no cervical adenopathy.  Neurological: He is alert and oriented to person, place, and time. He has normal reflexes. Coordination normal.  Skin: Skin is warm and dry. No rash noted. He is not diaphoretic. No erythema. No pallor.  No adenopathy.  Psychiatric: He has a normal mood and affect. His behavior is normal. Judgment and thought content normal.    Data Reviewed Progress notes. Imaging reports.  Assessment    Abdominal pain and gallstones. An it is now more clear that he is symptomatic from his gallbladder. He would benefit from cholecystectomy.  Desires Port-A-Cath removal   History of follicular lymphoma, currently in remission   History positive HBV blood test   Negative colonoscopy, 2012   Tobacco abuse     Plan    We'll schedule for removal of  Port-A-Cath and laparoscopic cholecystectomy with cholangiogram, possible open.   I discussed the indications, details, techniques, and numerous risk of the surgery with him. He understands all these issues and all his questions were answered. He agrees with this plan.       [name]. Dalbert Batman, M.D., [facility] General and Minimally invasive Surgery Breast and Colorectal Surgery Office:   [phone] Pager:   [phone]  10/05/2013, 10:50 AM

## 2013-10-19 ENCOUNTER — Encounter (HOSPITAL_COMMUNITY): Payer: Self-pay | Admitting: Pharmacy Technician

## 2013-10-19 NOTE — Progress Notes (Signed)
Chest CT 12/14 EPIC 

## 2013-10-19 NOTE — Patient Instructions (Signed)
      Your procedure is scheduled on: 10/26/13  MONDAY   Report to [facility] at 0830      AM.  Call this number if you have problems the morning of surgery: [phone]        Do not eat food  Or drink :After Midnight. Sunday NIGHT   Take these medicines the morning of surgery with A SIP OF WATER:Bupropion,Lexapro, Morphine May take  Oxydodone if needed   .  Contacts, dentures or partial plates, or metal hairpins  can not be worn to surgery. Your family will be responsible for glasses, dentures, hearing aides while you are in surgery  Leave suitcase in the car. After surgery it may be brought to your room.  For patients admitted to the hospital, checkout time is 11:00 AM day of  discharge.                DO NOT WEAR JEWELRY, LOTIONS, POWDERS, OR PERFUMES.  WOMEN-- DO NOT SHAVE LEGS OR UNDERARMS FOR 48 HOURS BEFORE SHOWERS. MEN MAY SHAVE FACE.  Patients discharged the day of surgery will not be allowed to drive home. IF going home the day of surgery, you must have a driver and someone to stay with you for the first 24 hours  Name and phone number of your driver:                                                                                           [place]                                                  Patient Signature _____________________________

## 2013-10-20 ENCOUNTER — Inpatient Hospital Stay (HOSPITAL_COMMUNITY)
Admission: RE | Admit: 2013-10-20 | Discharge: 2013-10-20 | Disposition: A | Discharge status: Home / Self Care | Payer: Self-pay | Source: Ambulatory Visit

## 2013-10-22 ENCOUNTER — Encounter (HOSPITAL_COMMUNITY)
Admission: RE | Admit: 2013-10-22 | Discharge: 2013-10-22 | Disposition: A | Discharge status: Home / Self Care | Payer: Medicare Other | Source: Ambulatory Visit | Attending: General Surgery | Admitting: General Surgery

## 2013-10-22 ENCOUNTER — Encounter (HOSPITAL_COMMUNITY): Payer: Self-pay

## 2013-10-22 DIAGNOSIS — Z01812 Encounter for preprocedural laboratory examination: Secondary | ICD-10-CM | POA: Insufficient documentation

## 2013-10-22 HISTORY — DX: Personal history of antineoplastic chemotherapy: Z92.21

## 2013-10-22 LAB — COMPREHENSIVE METABOLIC PANEL
ALBUMIN: 4.2 g/dL (ref 3.5–5.2)
ALT: 18 U/L (ref 0–53)
AST: 17 U/L (ref 0–37)
Alkaline Phosphatase: 50 U/L (ref 39–117)
BUN: 12 mg/dL (ref 6–23)
CHLORIDE: 98 meq/L (ref 96–112)
CO2: 33 mEq/L — ABNORMAL HIGH (ref 19–32)
CREATININE: 1.03 mg/dL (ref 0.50–1.35)
Calcium: 9.9 mg/dL (ref 8.4–10.5)
GFR calc Af Amer: >90 mL/min (ref >90)
GFR calc non Af Amer: 80 mL/min — ABNORMAL LOW (ref >90)
Glucose, Bld: 99 mg/dL (ref 70–99)
Potassium: 4.2 mEq/L (ref 3.7–5.3)
Sodium: 141 mEq/L (ref 137–147)
Total Bilirubin: 0.5 mg/dL (ref 0.3–1.2)
Total Protein: 6.7 g/dL (ref 6.0–8.3)

## 2013-10-22 LAB — CBC WITH DIFFERENTIAL/PLATELET
BASOS ABS: 0 10*3/uL (ref 0.0–0.1)
BASOS PCT: 0 % (ref 0–1)
EOS ABS: 0.2 10*3/uL (ref 0.0–0.7)
Eosinophils Relative: 2 % (ref 0–5)
HCT: 48.6 % (ref 39.0–52.0)
HEMOGLOBIN: 17 g/dL (ref 13.0–17.0)
Lymphocytes Relative: 25 % (ref 12–46)
Lymphs Abs: 2 10*3/uL (ref 0.7–4.0)
MCH: 29.9 pg (ref 26.0–34.0)
MCHC: 35 g/dL (ref 30.0–36.0)
MCV: 85.6 fL (ref 78.0–100.0)
MONO ABS: 0.7 10*3/uL (ref 0.1–1.0)
MONOS PCT: 9 % (ref 3–12)
NEUTROS ABS: 5 10*3/uL (ref 1.7–7.7)
NEUTROS PCT: 64 % (ref 43–77)
Platelets: 189 10*3/uL (ref 150–400)
RBC: 5.68 MIL/uL (ref 4.22–5.81)
RDW: 12.5 % (ref 11.5–15.5)
WBC: 7.9 10*3/uL (ref 4.0–10.5)

## 2013-10-22 NOTE — Patient Instructions (Addendum)
[place]  10/22/2013   Your procedure is scheduled on: Monday February 23rd  Report to [facility] a 830 AM.  Call this number if you have problems the morning of surgery [phone]   Remember:  Do not eat food or drink liquids :After Midnight.     Take these medicines the morning of surgery with A SIP OF WATER: wellbutrin, lexapro, ms contin                                SEE [facility] PREPARING FOR SURGERY SHEET             You may not have any metal on your body including hair pins and piercings  Do not wear jewelry, make-up.  Do not wear lotions, powders, or perfumes. No  Deodorant is to be worn.   Men may shave face and neck.  Do not bring valuables to the hospital. [place].  Contacts, dentures or bridgework may not be worn into surgery.  Leave suitcase in the car. After surgery it may be brought to your room.  For patients admitted to the hospital, checkout time is 11:00 AM the day of discharge.   Please read over the following fact sheets that you were given: [facility] Preparing for surgery sheet, Call Zelphia Cairo RN pre op nurse if needed [phone]    [place].  PATIENT SIGNATURE___________________________________________  NURSE SIGNATURE_____________________________________________

## 2013-10-26 ENCOUNTER — Ambulatory Visit (HOSPITAL_COMMUNITY): Payer: Medicare Other

## 2013-10-26 ENCOUNTER — Encounter (HOSPITAL_COMMUNITY)
Admission: RE | Disposition: A | Discharge status: Home / Self Care | Payer: Self-pay | Source: Ambulatory Visit | Attending: General Surgery

## 2013-10-26 ENCOUNTER — Encounter (HOSPITAL_COMMUNITY): Payer: Medicare Other | Admitting: Anesthesiology

## 2013-10-26 ENCOUNTER — Encounter (HOSPITAL_COMMUNITY): Payer: Self-pay

## 2013-10-26 ENCOUNTER — Ambulatory Visit (HOSPITAL_COMMUNITY): Payer: Medicare Other | Admitting: Anesthesiology

## 2013-10-26 ENCOUNTER — Ambulatory Visit (HOSPITAL_COMMUNITY)
Admission: RE | Admit: 2013-10-26 | Discharge: 2013-10-27 | Disposition: A | Discharge status: Home / Self Care | Payer: Medicare Other | Source: Ambulatory Visit | Attending: General Surgery | Admitting: General Surgery

## 2013-10-26 DIAGNOSIS — B1910 Unspecified viral hepatitis B without hepatic coma: Secondary | ICD-10-CM | POA: Insufficient documentation

## 2013-10-26 DIAGNOSIS — K8020 Calculus of gallbladder without cholecystitis without obstruction: Secondary | ICD-10-CM | POA: Diagnosis present

## 2013-10-26 DIAGNOSIS — R1011 Right upper quadrant pain: Secondary | ICD-10-CM | POA: Insufficient documentation

## 2013-10-26 DIAGNOSIS — C8299 Follicular lymphoma, unspecified, extranodal and solid organ sites: Secondary | ICD-10-CM | POA: Insufficient documentation

## 2013-10-26 DIAGNOSIS — Z452 Encounter for adjustment and management of vascular access device: Secondary | ICD-10-CM | POA: Insufficient documentation

## 2013-10-26 DIAGNOSIS — Z95828 Presence of other vascular implants and grafts: Secondary | ICD-10-CM

## 2013-10-26 DIAGNOSIS — M810 Age-related osteoporosis without current pathological fracture: Secondary | ICD-10-CM | POA: Insufficient documentation

## 2013-10-26 DIAGNOSIS — F17200 Nicotine dependence, unspecified, uncomplicated: Secondary | ICD-10-CM | POA: Insufficient documentation

## 2013-10-26 DIAGNOSIS — K8010 Calculus of gallbladder with chronic cholecystitis without obstruction: Secondary | ICD-10-CM | POA: Insufficient documentation

## 2013-10-26 DIAGNOSIS — Z79899 Other long term (current) drug therapy: Secondary | ICD-10-CM | POA: Insufficient documentation

## 2013-10-26 HISTORY — PX: CHOLECYSTECTOMY: SHX55

## 2013-10-26 HISTORY — PX: PORT-A-CATH REMOVAL: SHX5289

## 2013-10-26 SURGERY — REMOVAL PORT-A-CATH
Anesthesia: General | Site: Chest | Laterality: Right

## 2013-10-26 MED ORDER — HYDROMORPHONE HCL PF 1 MG/ML IJ SOLN
0.5000 mg | INTRAMUSCULAR | Status: DC | PRN
  Administered 2013-10-27 (×3): 1 mg via INTRAVENOUS
  Filled 2013-10-26 (×3): qty 1

## 2013-10-26 MED ORDER — POTASSIUM CHLORIDE IN NACL 20-0.9 MEQ/L-% IV SOLN
INTRAVENOUS | Status: DC
  Administered 2013-10-26 – 2013-10-27 (×2): via INTRAVENOUS
  Filled 2013-10-26 (×3): qty 1000

## 2013-10-26 MED ORDER — PROMETHAZINE HCL 25 MG/ML IJ SOLN
6.2500 mg | INTRAMUSCULAR | Status: DC | PRN
  Administered 2013-10-26: 12.5 mg via INTRAVENOUS

## 2013-10-26 MED ORDER — PROMETHAZINE HCL 25 MG/ML IJ SOLN
INTRAMUSCULAR | Status: AC
Start: 2013-10-26 — End: 2013-10-27
  Filled 2013-10-26: qty 1

## 2013-10-26 MED ORDER — PROPOFOL 10 MG/ML IV BOLUS
INTRAVENOUS | Status: AC
  Filled 2013-10-26: qty 20

## 2013-10-26 MED ORDER — KETOROLAC TROMETHAMINE 30 MG/ML IJ SOLN
INTRAMUSCULAR | Status: AC
  Filled 2013-10-26: qty 1

## 2013-10-26 MED ORDER — NEOSTIGMINE METHYLSULFATE 1 MG/ML IJ SOLN
INTRAMUSCULAR | Status: AC
  Filled 2013-10-26: qty 10

## 2013-10-26 MED ORDER — ONDANSETRON HCL 4 MG/2ML IJ SOLN
INTRAMUSCULAR | Status: AC
  Filled 2013-10-26: qty 2

## 2013-10-26 MED ORDER — MIDAZOLAM HCL 5 MG/5ML IJ SOLN
INTRAMUSCULAR | Status: DC | PRN
  Administered 2013-10-26 (×2): 1 mg via INTRAVENOUS

## 2013-10-26 MED ORDER — ONDANSETRON HCL 4 MG/2ML IJ SOLN
INTRAMUSCULAR | Status: DC | PRN
  Administered 2013-10-26: 4 mg via INTRAVENOUS

## 2013-10-26 MED ORDER — HYDROMORPHONE HCL PF 1 MG/ML IJ SOLN
0.2500 mg | INTRAMUSCULAR | Status: DC | PRN
  Administered 2013-10-26: 0.5 mg via INTRAVENOUS

## 2013-10-26 MED ORDER — DEXAMETHASONE SODIUM PHOSPHATE 10 MG/ML IJ SOLN
INTRAMUSCULAR | Status: AC
  Filled 2013-10-26: qty 1

## 2013-10-26 MED ORDER — BUPIVACAINE-EPINEPHRINE 0.5% -1:200000 IJ SOLN
INTRAMUSCULAR | Status: AC
  Filled 2013-10-26: qty 1

## 2013-10-26 MED ORDER — FENTANYL CITRATE 0.05 MG/ML IJ SOLN
INTRAMUSCULAR | Status: AC
  Filled 2013-10-26: qty 5

## 2013-10-26 MED ORDER — ONDANSETRON HCL 4 MG/2ML IJ SOLN
4.0000 mg | Freq: Four times a day (QID) | INTRAMUSCULAR | Status: DC | PRN
Start: 2013-10-26 — End: 2013-10-27

## 2013-10-26 MED ORDER — ROCURONIUM BROMIDE 100 MG/10ML IV SOLN
INTRAVENOUS | Status: AC
  Filled 2013-10-26: qty 1

## 2013-10-26 MED ORDER — DEXAMETHASONE SODIUM PHOSPHATE 10 MG/ML IJ SOLN
INTRAMUSCULAR | Status: DC | PRN
  Administered 2013-10-26: 10 mg via INTRAVENOUS

## 2013-10-26 MED ORDER — ROCURONIUM BROMIDE 100 MG/10ML IV SOLN
INTRAVENOUS | Status: DC | PRN
  Administered 2013-10-26: 20 mg via INTRAVENOUS
  Administered 2013-10-26: 30 mg via INTRAVENOUS

## 2013-10-26 MED ORDER — ZOLPIDEM TARTRATE 5 MG PO TABS
5.0000 mg | ORAL_TABLET | Freq: Every evening | ORAL | Status: DC | PRN
  Administered 2013-10-26: 5 mg via ORAL
  Filled 2013-10-26: qty 1

## 2013-10-26 MED ORDER — GLYCOPYRROLATE 0.2 MG/ML IJ SOLN
INTRAMUSCULAR | Status: AC
  Filled 2013-10-26: qty 3

## 2013-10-26 MED ORDER — GLYCOPYRROLATE 0.2 MG/ML IJ SOLN
INTRAMUSCULAR | Status: DC | PRN
  Administered 2013-10-26: 0.6 mg via INTRAVENOUS

## 2013-10-26 MED ORDER — NEOSTIGMINE METHYLSULFATE 1 MG/ML IJ SOLN
INTRAMUSCULAR | Status: DC | PRN
  Administered 2013-10-26: 4 mg via INTRAVENOUS

## 2013-10-26 MED ORDER — CEFAZOLIN SODIUM-DEXTROSE 2-3 GM-% IV SOLR
INTRAVENOUS | Status: AC
  Filled 2013-10-26: qty 50

## 2013-10-26 MED ORDER — MIDAZOLAM HCL 2 MG/2ML IJ SOLN
0.5000 mg | INTRAMUSCULAR | Status: DC
  Administered 2013-10-26 (×2): 0.5 mg via INTRAVENOUS

## 2013-10-26 MED ORDER — HYDROMORPHONE HCL PF 1 MG/ML IJ SOLN
INTRAMUSCULAR | Status: AC
  Filled 2013-10-26: qty 1

## 2013-10-26 MED ORDER — BUPIVACAINE-EPINEPHRINE 0.5% -1:200000 IJ SOLN
INTRAMUSCULAR | Status: DC | PRN
  Administered 2013-10-26: 32 mL

## 2013-10-26 MED ORDER — FENTANYL CITRATE 0.05 MG/ML IJ SOLN
INTRAMUSCULAR | Status: DC | PRN
  Administered 2013-10-26: 50 ug via INTRAVENOUS
  Administered 2013-10-26: 100 ug via INTRAVENOUS
  Administered 2013-10-26 (×2): 50 ug via INTRAVENOUS

## 2013-10-26 MED ORDER — SUCCINYLCHOLINE CHLORIDE 20 MG/ML IJ SOLN
INTRAMUSCULAR | Status: DC | PRN
  Administered 2013-10-26: 100 mg via INTRAVENOUS

## 2013-10-26 MED ORDER — BUPIVACAINE-EPINEPHRINE 0.25% -1:200000 IJ SOLN
INTRAMUSCULAR | Status: AC
  Filled 2013-10-26: qty 1

## 2013-10-26 MED ORDER — ONDANSETRON HCL 4 MG PO TABS
4.0000 mg | ORAL_TABLET | Freq: Four times a day (QID) | ORAL | Status: DC | PRN
Start: 2013-10-26 — End: 2013-10-27

## 2013-10-26 MED ORDER — MIDAZOLAM HCL 2 MG/2ML IJ SOLN
INTRAMUSCULAR | Status: AC
  Filled 2013-10-26: qty 2

## 2013-10-26 MED ORDER — MEPERIDINE HCL 50 MG/ML IJ SOLN: 6.2500 mg | INTRAMUSCULAR | Status: DC | PRN

## 2013-10-26 MED ORDER — OXYCODONE HCL 5 MG/5ML PO SOLN
5.0000 mg | Freq: Once | ORAL | Status: DC | PRN
  Filled 2013-10-26: qty 5

## 2013-10-26 MED ORDER — ENOXAPARIN SODIUM 40 MG/0.4ML ~~LOC~~ SOLN
40.0000 mg | SUBCUTANEOUS | Status: DC
  Filled 2013-10-26: qty 0.4

## 2013-10-26 MED ORDER — HYDROMORPHONE HCL PF 1 MG/ML IJ SOLN
0.2500 mg | INTRAMUSCULAR | Status: DC | PRN
  Administered 2013-10-26 (×4): 0.5 mg via INTRAVENOUS

## 2013-10-26 MED ORDER — OXYCODONE-ACETAMINOPHEN 5-325 MG PO TABS
1.0000 | ORAL_TABLET | ORAL | Status: DC | PRN
  Administered 2013-10-26 – 2013-10-27 (×4): 2 via ORAL
  Filled 2013-10-26 (×4): qty 2

## 2013-10-26 MED ORDER — OXYCODONE HCL 5 MG PO TABS: 5.0000 mg | ORAL_TABLET | Freq: Once | ORAL | Status: DC | PRN

## 2013-10-26 MED ORDER — KETOROLAC TROMETHAMINE 30 MG/ML IJ SOLN
30.0000 mg | Freq: Once | INTRAMUSCULAR | Status: AC | PRN
  Administered 2013-10-26: 30 mg via INTRAVENOUS

## 2013-10-26 MED ORDER — IOHEXOL 300 MG/ML  SOLN
INTRAMUSCULAR | Status: DC | PRN
  Administered 2013-10-26: 50 mL via INTRAVENOUS

## 2013-10-26 MED ORDER — PROPOFOL 10 MG/ML IV BOLUS
INTRAVENOUS | Status: DC | PRN
  Administered 2013-10-26: 180 mg via INTRAVENOUS

## 2013-10-26 MED ORDER — LACTATED RINGERS IV SOLN
INTRAVENOUS | Status: DC
  Administered 2013-10-26: 13:00:00 via INTRAVENOUS
  Administered 2013-10-26: 1000 mL via INTRAVENOUS

## 2013-10-26 MED ORDER — CEFAZOLIN SODIUM-DEXTROSE 2-3 GM-% IV SOLR
2.0000 g | INTRAVENOUS | Status: AC
  Administered 2013-10-26: 2 g via INTRAVENOUS

## 2013-10-26 SURGICAL SUPPLY — 49 items
APPLIER CLIP ROT 10 11.4 M/L (STAPLE) ×5
BENZOIN TINCTURE PRP APPL 2/3 (GAUZE/BANDAGES/DRESSINGS) IMPLANT
CANISTER SUCTION 2500CC (MISCELLANEOUS) IMPLANT
CHLORAPREP W/TINT 10.5 ML (MISCELLANEOUS) IMPLANT
CHLORAPREP W/TINT 26ML (MISCELLANEOUS) ×5 IMPLANT
CLIP APPLIE ROT 10 11.4 M/L (STAPLE) ×3 IMPLANT
CLOSURE WOUND 1/2 X4 (GAUZE/BANDAGES/DRESSINGS)
COVER MAYO STAND STRL (DRAPES) ×5 IMPLANT
DECANTER SPIKE VIAL GLASS SM (MISCELLANEOUS) ×5 IMPLANT
DERMABOND ADVANCED (GAUZE/BANDAGES/DRESSINGS) ×2
DERMABOND ADVANCED .7 DNX12 (GAUZE/BANDAGES/DRESSINGS) ×3 IMPLANT
DRAPE C-ARM 42X120 X-RAY (DRAPES) ×5 IMPLANT
DRAPE LAPAROSCOPIC ABDOMINAL (DRAPES) ×5 IMPLANT
DRAPE LAPAROTOMY TRNSV 102X78 (DRAPE) IMPLANT
DRAPE UTILITY XL STRL (DRAPES) ×5 IMPLANT
ELECT REM PT RETURN 9FT ADLT (ELECTROSURGICAL) ×5
ELECTRODE REM PT RTRN 9FT ADLT (ELECTROSURGICAL) ×3 IMPLANT
GAUZE SPONGE 4X4 16PLY XRAY LF (GAUZE/BANDAGES/DRESSINGS) IMPLANT
GLOVE BIOGEL PI IND STRL 7.0 (GLOVE) ×3 IMPLANT
GLOVE BIOGEL PI INDICATOR 7.0 (GLOVE) ×2
GLOVE EUDERMIC 7 POWDERFREE (GLOVE) ×5 IMPLANT
GOWN STRL REUS W/TWL LRG LVL3 (GOWN DISPOSABLE) IMPLANT
GOWN STRL REUS W/TWL XL LVL3 (GOWN DISPOSABLE) ×20 IMPLANT
HEMOSTAT SNOW SURGICEL 2X4 (HEMOSTASIS) ×5 IMPLANT
KIT BASIN OR (CUSTOM PROCEDURE TRAY) ×5 IMPLANT
MANIFOLD NEPTUNE II (INSTRUMENTS) ×5 IMPLANT
NEEDLE HYPO 25X1 1.5 SAFETY (NEEDLE) ×5 IMPLANT
PACK BASIC VI WITH GOWN DISP (CUSTOM PROCEDURE TRAY) IMPLANT
PACK UNIVERSAL I (CUSTOM PROCEDURE TRAY) ×5 IMPLANT
PENCIL BUTTON HOLSTER BLD 10FT (ELECTRODE) ×5 IMPLANT
POUCH SPECIMEN RETRIEVAL 10MM (ENDOMECHANICALS) ×5 IMPLANT
SCISSORS LAP 5X35 DISP (ENDOMECHANICALS) ×5 IMPLANT
SET CHOLANGIOGRAPH MIX (MISCELLANEOUS) ×5 IMPLANT
SET IRRIG TUBING LAPAROSCOPIC (IRRIGATION / IRRIGATOR) ×5 IMPLANT
SLEEVE XCEL OPT CAN 5 100 (ENDOMECHANICALS) ×5 IMPLANT
SOLUTION ANTI FOG 6CC (MISCELLANEOUS) ×5 IMPLANT
STRIP CLOSURE SKIN 1/2X4 (GAUZE/BANDAGES/DRESSINGS) IMPLANT
SUT MNCRL AB 4-0 PS2 18 (SUTURE) ×5 IMPLANT
SUT VIC AB 3-0 SH 27 (SUTURE) ×2
SUT VIC AB 3-0 SH 27XBRD (SUTURE) ×3 IMPLANT
SUT VIC AB 3-0 SH 8-18 (SUTURE) ×5 IMPLANT
SYR CONTROL 10ML LL (SYRINGE) IMPLANT
TOWEL OR 17X26 10 PK STRL BLUE (TOWEL DISPOSABLE) ×5 IMPLANT
TOWEL OR NON WOVEN STRL DISP B (DISPOSABLE) ×5 IMPLANT
TRAY LAP CHOLE (CUSTOM PROCEDURE TRAY) ×5 IMPLANT
TROCAR BLADELESS OPT 5 100 (ENDOMECHANICALS) ×5 IMPLANT
TROCAR XCEL BLUNT TIP 100MML (ENDOMECHANICALS) ×5 IMPLANT
TROCAR XCEL NON-BLD 11X100MML (ENDOMECHANICALS) ×5 IMPLANT
TUBING INSUFFLATION 10FT LAP (TUBING) ×5 IMPLANT

## 2013-10-26 NOTE — H&P (Signed)
Edwin Yu   MRN:  [id number]   Description: 58 year old male  Provider: Adin Hector, MD  Department: [facility]              Diagnoses      Gallstones    -  Primary      574.20      Port catheter in place          V45.89      Lymphoma          202.80                    Current Vitals - Last Recorded      BP Pulse Temp(Src) Resp Ht Wt      130/86 88 98.5 F (36.9 C) (Oral) 15 5\' 10"  (1.778 m) 179 lb 12.8 oz (81.557 kg)            BMI 25.80 kg/m2                      History and Physical    Adin Hector, MD     Status: Signed            Patient ID: Edwin Yu, male   DOB: Feb 21, 1956, 58 y.o.   MRN: [id number]           HPI Edwin Yu is a 58 y.o. male.  He returns for further discussion about management and treatment of his Port-A-Cath removal and gallstones.   He states that he intermittently has right upper quadrant pain now. His hepatobiliary scan shows nonvisualization of the gallbladder, even after morphine. He had no symptoms during the biliary scan.   He states that the Port-A-Cath bothers him more. Somewhat tender. He'sgained 10 pounds of muscle weight since she's been taking testosterone.   Initial history is summarized as follows: He was referred back to me by Dr. Alvy Bimler at the [facility] for consideration of port catheter removal and for evaluation of abdominal pain and gallstones.   The patient states that he has periumbilical and central abdominal pain intermittently. This is more of a tenderness than a spontaneous pain. It is intermittent. It is not related to meals. There is no nausea or vomiting. He has normal bowel movements. Weight is stable. In fact, he has been on testosterone is and is gained about 6 or 7 pounds and feels pretty good.   Past history is significant for lymphoma. I put a Port-A-Cath in him after biopsying a right axillary lymph node. He has had  CHOP chemotherapy in 2011 and then was  on maintenance rituximab through November 2013. Recent CT chest at her pelvis showed no evidence of disease.   Normal colonoscopy by Dr. Benson Norway, 2012. History HBV positive blood test. Has never been jaundiced.       Past Medical History   Diagnosis  Date   .  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   [name]  Chronic lower back pain     .  Hepatitis B antibody positive  2011       on Lamivudine until finish of maintenance Rituxan in 08/2012.   [name]  Cancer         nhl   .  Osteoporosis     .  Abdominal pain, unspecified site  08/07/2013   .  Need for prophylactic vaccination and inoculation against influenza  08/07/2013         Past Surgical History   Procedure  Laterality  Date   .  Dental surgery           had all teeth removed   .  Portacath placement       .  Lymph node biopsy       .  Wrist surgery           broke right         Family History   Problem  Relation  Age of Onset   .  Cancer  Father         colon cancer        Social History History   Substance Use Topics   .  Smoking status:  Current Every Day Smoker -- 0.50 packs/day for 30 years       Types:  Cigarettes   .  Smokeless tobacco:  Never Used         Comment: cutting down gradually   .  Alcohol Use:  No        No Known Allergies    Current Outpatient Prescriptions   Medication  Sig  Dispense  Refill   .  alendronate (FOSAMAX) 70 MG tablet  Take 70 mg by mouth once a week. Take with a full glass of water on an empty stomach.         [name]  buPROPion (WELLBUTRIN XL) 150 MG 24 hr tablet  Take 150 mg by mouth daily.          .  diphenhydramine-acetaminophen (TYLENOL PM) 25-500 MG TABS  Take 1 tablet by mouth at bedtime as needed.           [name]  escitalopram (LEXAPRO) 10 MG tablet  Take 5 mg by mouth Daily.         [name]  lidocaine-prilocaine (EMLA)  cream  Apply 1 application topically as needed. Apply to porta cath site one hour prior to needle access   30 g   1   .  morphine (MS CONTIN) 100 MG 12 hr tablet  Take 100 mg by mouth 3 (three) times daily.           [name]  oxyCODONE (OXY IR/ROXICODONE) 5 MG immediate release tablet  Take 5 mg by mouth 4 (four) times daily.          [name]  testosterone cypionate (DEPOTESTOTERONE CYPIONATE) 200 MG/ML injection  Inject 200 mg into the muscle Every 14 days.                Review of Systems   Constitutional: Negative for fever, chills and unexpected weight change.  HENT: Negative for congestion, hearing loss, sore throat, trouble swallowing and voice change.   Eyes: Negative for visual disturbance.  Respiratory: Negative for cough and wheezing.   Cardiovascular: Negative for chest pain, palpitations and leg swelling.  Gastrointestinal: Positive for abdominal pain. Negative for nausea, vomiting, diarrhea, constipation, blood in stool, abdominal distention, anal bleeding and rectal pain.  Genitourinary: Negative for hematuria and difficulty urinating.  Musculoskeletal: Negative for arthralgias.  Skin: Negative for rash and wound.  Neurological: Negative for seizures, syncope, weakness and headaches.  Hematological: Negative for adenopathy. Does not bruise/bleed easily.  Psychiatric/Behavioral: Negative for confusion. The patient is nervous/anxious.       Blood pressure 130/86, pulse 88,  temperature 98.5 F (36.9 C), temperature source Oral, resp. rate 15, height 5\' 10"  (1.778 m), weight 179 lb 12.8 oz (81.557 kg).   Physical Exam   Constitutional: He is oriented to person, place, and time. He appears well-developed and well-nourished. No distress.  HENT:   Head: Normocephalic.   Nose: Nose normal.   Mouth/Throat: No oropharyngeal exudate.  Eyes: Conjunctivae and EOM are normal. Pupils are equal, round, and reactive to light. Right eye exhibits no discharge. Left eye exhibits no discharge. No  scleral icterus.  Neck: Normal range of motion. Neck supple. No JVD present. No tracheal deviation present. No thyromegaly present.  Cardiovascular: Normal rate, regular rhythm, normal heart sounds and intact distal pulses.    No murmur heard. Pulmonary/Chest: Effort normal and breath sounds normal. No stridor. No respiratory distress. He has no wheezes. He has no rales. He exhibits no tenderness.  Port in right infraclavicular area with catheter entering right internal jugular vein. Appears well healed.  Abdominal: Soft. Bowel sounds are normal. He exhibits no distension and no mass. There is tenderness. There is no rebound and no guarding.  Today, he is a little tender in the right upper quadrant.  Musculoskeletal: Normal range of motion. He exhibits no edema and no tenderness.  Lymphadenopathy:    He has no cervical adenopathy.  Neurological: He is alert and oriented to person, place, and time. He has normal reflexes. Coordination normal.  Skin: Skin is warm and dry. No rash noted. He is not diaphoretic. No erythema. No pallor.  No adenopathy.  Psychiatric: He has a normal mood and affect. His behavior is normal. Judgment and thought content normal.      Data Reviewed Progress notes. Imaging reports.   Assessment     Abdominal pain and gallstones. An it is now more clear that he is symptomatic from his gallbladder. He would benefit from cholecystectomy.   Desires Port-A-Cath removal    History of follicular lymphoma, currently in remission    History positive HBV blood test    Negative colonoscopy, 2012    Tobacco abuse       Plan    We'll schedule for removal of Port-A-Cath and laparoscopic cholecystectomy with cholangiogram, possible open.    I discussed the indications, details, techniques, and numerous risk of the surgery with him. He understands all these issues and all his questions were answered. He agrees with this plan.          [name].  Dalbert Batman, M.D., [facility] General and Minimally invasive Surgery Breast and Colorectal Surgery Office:   [phone] Pager:   [phone]

## 2013-10-26 NOTE — Anesthesia Preprocedure Evaluation (Signed)
Anesthesia Evaluation  Patient identified by MRN, date of birth, ID band Patient awake    Reviewed: Allergy & Precautions, H&P , NPO status , Patient's Chart, lab work & pertinent test results  Airway Mallampati: II TM Distance: >3 FB Neck ROM: Full    Dental  (+) Dental Advisory Given   Pulmonary Current Smoker,  breath sounds clear to auscultation        Cardiovascular negative cardio ROS  Rhythm:Regular Rate:Normal     Neuro/Psych negative neurological ROS  negative psych ROS   GI/Hepatic negative GI ROS, (+) Hepatitis -  Endo/Other  negative endocrine ROS  Renal/GU negative Renal ROS     Musculoskeletal negative musculoskeletal ROS (+)   Abdominal   Peds  Hematology  (+) Blood dyscrasia, ,   Anesthesia Other Findings   Reproductive/Obstetrics negative OB ROS                           Anesthesia Physical Anesthesia Plan  ASA: II  Anesthesia Plan: General   Post-op Pain Management:    Induction: Intravenous  Airway Management Planned: Oral ETT  Additional Equipment:   Intra-op Plan:   Post-operative Plan: Extubation in OR  Informed Consent: I have reviewed the patients History and Physical, chart, labs and discussed the procedure including the risks, benefits and alternatives for the proposed anesthesia with the patient or authorized representative who has indicated his/her understanding and acceptance.   Dental advisory given  Plan Discussed with: CRNA  Anesthesia Plan Comments:         Anesthesia Quick Evaluation

## 2013-10-26 NOTE — Progress Notes (Signed)
Patient informed me that he took 100mg  of MS contin from his home prescription this afternoon at 1600. Informed the patient of hospital policy to take hospital prescribed pain medications and to discontinue taking home medications. Patient stated that no one told him of this policy and was "unaware" that he had pain medications ordered while here. Informed patient of pain medication ordered for him while staying here. Patient verbally stated he understood and agreed. MD notified. VSS- will continue to monitor.

## 2013-10-26 NOTE — Preoperative (Signed)
Beta Blockers   Reason not to administer Beta Blockers:Not Applicable 

## 2013-10-26 NOTE — Anesthesia Postprocedure Evaluation (Signed)
Anesthesia Post Note  Patient: Edwin Yu  Procedure(s) Performed: Procedure(s) (LRB): REMOVAL PORT-A-CATH (Right) LAPAROSCOPIC CHOLECYSTECTOMY  Anesthesia type: General  Patient location: PACU  Post pain: Pain level controlled  Post assessment: Post-op Vital signs reviewed  Last Vitals: BP 126/70  Pulse 72  Temp(Src) 37 C (Oral)  Resp 18  Ht 5\' 10"  (1.778 m)  Wt 180 lb (81.647 kg)  BMI 25.83 kg/m2  SpO2 96%  Post vital signs: Reviewed  Level of consciousness: sedated  Complications: No apparent anesthesia complications

## 2013-10-26 NOTE — Interval H&P Note (Signed)
History and Physical Interval Note:  10/26/2013 10:09 AM  Edwin Yu  has presented today for surgery, with the diagnosis of gallstones   The goals and the various methods of treatment have been discussed with the patient and family. After consideration of risks, benefits and other options for treatment, the patient has consented to  Procedure(s): REMOVAL PORT-A-CATH (N/A) LAPAROSCOPIC CHOLECYSTECTOMY WITH INTRAOPERATIVE CHOLANGIOGRAM (N/A) as a surgical intervention .  The patient's history has been reviewed, patient examined, no change in status, stable for surgery.  I have reviewed the patient's chart and labs.  Questions were answered to the patient's satisfaction.     Adin Hector

## 2013-10-26 NOTE — Transfer of Care (Signed)
Immediate Anesthesia Transfer of Care Note  Patient: Edwin Yu  Procedure(s) Performed: Procedure(s): REMOVAL PORT-A-CATH (Right) LAPAROSCOPIC CHOLECYSTECTOMY  Patient Location: PACU  Anesthesia Type:General  Level of Consciousness: awake, alert , oriented and patient cooperative  Airway & Oxygen Therapy: Patient Spontanous Breathing and Patient connected to face mask oxygen  Post-op Assessment: Report given to PACU RN and Post -op Vital signs reviewed and stable  Post vital signs: Reviewed and stable  Complications: No apparent anesthesia complications

## 2013-10-26 NOTE — Op Note (Signed)
Patient Name:           Edwin Yu   Date of Surgery:        10/26/2013  Pre op Diagnosis:      Chronic cholecystitis with cholelithiasis, lymphoma in remission requesting Port-A-Cath removal  Post op Diagnosis:    Same  Procedure:                 Removal of Port-A-Cath, laparoscopic cholecystectomy  Surgeon:                     [name]. Dalbert Batman, M.D., FACS  Assistant:                      Greer Pickerel, M.D., FACS  Operative Indications:   Edwin Yu is a 58 y.o. male.      Edwin Yu  He was referred back to me by Dr. Alvy Bimler at the [facility] for consideration of port catheter removal and for evaluation of abdominal pain and gallstones.  The patient states that he has periumbilical and central abdominal pain intermittently. This is more of a tenderness than a spontaneous pain. It is intermittent. It is not related to meals. There is no nausea or vomiting. He has normal bowel movements. Weight is stable. In fact, he has been on testosterone is and is gained about 6 or 7 pounds and feels pretty good. Recent CT scan shows gallstones and gallbladder sludge. Borderline biliary ductal dilatation. No evidence of lymphoma. Liver function tests are normal.His hepatobiliary scan shows nonvisualization of the gallbladder, even after morphine. He had no symptoms during the biliary scan. Past history is significant for lymphoma. I put a Port-A-Cath in him after biopsying a right axillary lymph node. He has had CHOP chemotherapy in 2011 and then was on maintenance rituximab through November 2013. Recent CT chest at her pelvis showed no evidence of disease.  Normal colonoscopy by Dr. Benson Norway, 2012. History HBV positive blood test. Has never been jaundiced.   Operative Findings:       The port was removed without difficulty and there was no bleeding. The gallbladder was chronically inflamed, but thin walled and contained viscous black bile. There were numerous chronic omental adhesions to the gallbladder.I could  not cannulate the cystic duct after 3 attempts at 3 different levels and so cholangiography was abandoned. There were no other gross abnormalities on general laparoscopic exploration.  Procedure in Detail:          Following the induction of general endotracheal anesthesia a surgical time out was performed and intravenous antibiotics were given. The neck and chest and abdomen were prepped and draped in a sterile fashion. 0.5% Marcaine was used as local infiltration anesthetic.      The transverse incision in the right infraclavicular area through the previous scar. I dissected down and into the capsule around the port. The 3 Prolene sutures were divided and removed. The portacatheter was then easily removed and were intact. There was no bleeding. Subcutaneous tissue was closed with interrupted 3-0 Vicryl and the skin closed with a running subcuticular suture of 4-0 Monocryl and Dermabond.  I then made a vertical incision at the lower rim of the umbilicus. The fascia was incised in the midline and the abdominal cavity entered under direct vision. 11 mm trocar was placed and secured with a  purse string suture of 0 Vicryl. An11 mm trocar was placed in the subxiphoid region and two 5 mm trocars in  the right upper quadrant. We identified the fundus of the gallbladder and lifted that up. I took  about 10 minutes taking all the adhesions down off of the gallbladder. I then slowly dissected out the cystic duct and isolating it from the cystic artery I made a nice window behind the cystic duct and cystic artery. Clips were placed on the cystic duct close to the gallbladder I opened the cystic duct and inserted a Cook catheter. I could not flush the catheter I removed it 2 or 3 times to try to open up the duct or milk small stones out. A couple of small stones came out but never could get flow. In dissecting the cystic duct further distally and opened one area that seemed thickened but there was no stones there. I  could not cannulate the duct any further it seemed very small. I then secured the cystic duct with multiple metal clips and divided it. I elected not to make any further attempts at cholangiography. The gallbladder was dissected from its bed with electrocautery. The cystic artery was controlled with hemoclips and divided. The gallbladder was chronically inflamed in the bed of the gallbladder somewhat raw. The gallbladder was removed by placing it in a specimen bag and removing it. I spent a good deal of time irrigating the subhepatic and subphrenic spaces until they were completely clear. I cauterized the bed of the liver and placed a piece of Snow hemostatic sponge and his worked well. At this point we felt that we had done all that was necessary. The trocars were removed. There was no bleeding at the trocar sites. The pneumoperitoneum was released. The fascia at the umbilicus was closed with 0 Vicryl sutures. The skin incisions were closed with subcuticular sutures of 4-0 Monocryl and Dermabond. The patient tolerated the procedure well taken to recovery in stable condition. EBL 20 cc. Counts correct complications none.     [name]. Dalbert Batman, M.D., FACS General and Minimally Invasive Surgery Breast and Colorectal Surgery  10/26/2013 12:35 PM

## 2013-10-27 NOTE — Discharge Summary (Signed)
Patient ID: Edwin Yu [id number] 58 y.o. July 05, 1956  Admit date: 10/26/2013  Discharge date and time: 10/27/2013  Admitting Physician: Adin Hector  Discharge Physician: Adin Hector  Admission Diagnoses: gallstones   Discharge Diagnoses: Chronic cholecystitis with cholelithiasis Lymphoma in remission, desires Port-A-Cath removal  Operations: Procedure(s): REMOVAL PORT-A-CATH LAPAROSCOPIC CHOLECYSTECTOMY  Admission Condition: good  Discharged Condition: good  Indication for Admission: [name] is a 58 y.o. male. [name]  He was referred back to me by Dr. Alvy Bimler at the [facility] for consideration of port catheter removal and for evaluation of abdominal pain and gallstones.  The patient states that he has periumbilical and central abdominal pain intermittently. This is more of a tenderness than a spontaneous pain. It is intermittent. It is not related to meals. There is no nausea or vomiting. He has normal bowel movements. Weight is stable.Recent CT scan shows gallstones and gallbladder sludge. Borderline biliary ductal dilatation. No evidence of lymphoma. Liver function tests are normal.His hepatobiliary scan shows nonvisualization of the gallbladder, even after morphine. He had no symptoms during the biliary scan.  Past history is significant for lymphoma. I put a Port-A-Cath in him after biopsying a right axillary lymph node. He has had CHOP chemotherapy in 2011 and then was on maintenance rituximab through November 2013. Recent CT chest at her pelvis showed no evidence of disease.  Normal colonoscopy by Dr. Benson Norway, 2012. History HBV positive blood test. Has never been jaundiced. We have discussed management in the outpatient setting. He would like to go ahead with cholecystectomy and Port-A-Cath removal   Hospital Course: On the day of admission the patient was taken the operating room and underwent removal of his Port-A-Cath and laparoscopic cholecystectomy. The  gallbladder was chronically inflamed with extensive omental adhesions to it. Otherwise thin walled. The cystic duct was occluded and could not be cannulated and so cholangiogram was not done. Postoperatively the patient did well. He became ambulatory and resumed diet. Voiding uneventfully. He brought his MS Contin to the hospital which he receives from his pain management physician in [place].  He states that he takes chronically at home because of his back pain. He states he does not need any further prescriptions for pain medication. On postop day 1 he was ready for discharge. Abdomen was soft and nontender. His Port-A-Cath site and his abdominal incisions all looked good. He was ready to go home. He was given instructions in diet and activities. No prescriptions were written. He was asked to return to see me in the office in 3 weeks.  Consults: None  Significant Diagnostic Studies: None  Treatments: surgery: Removal of Port-A-Cath, laparoscopic cholecystectomy  Disposition: Home  Patient Instructions:    Medication List         alendronate 70 MG tablet  Commonly known as:  FOSAMAX  Take 70 mg by mouth every Tuesday. Take with a full glass of water on an empty stomach.     aspirin 81 MG tablet  Take 81 mg by mouth daily.     buPROPion 150 MG 24 hr tablet  Commonly known as:  WELLBUTRIN XL  Take 150 mg by mouth every morning.     diphenhydramine-acetaminophen 25-500 MG Tabs  Commonly known as:  TYLENOL PM  Take 1 tablet by mouth at bedtime as needed (sleep).     escitalopram 10 MG tablet  Commonly known as:  LEXAPRO  Take 10 mg by mouth every morning.     lidocaine-prilocaine cream  Commonly known as:  EMLA  Apply 1 application topically as needed. Apply to porta cath site one hour prior to needle access     morphine 100 MG 12 hr tablet  Commonly known as:  MS CONTIN  Take 100 mg by mouth 3 (three) times daily.     oxyCODONE 5 MG immediate release tablet  Commonly known  as:  Oxy IR/ROXICODONE  Take 5 mg by mouth 4 (four) times daily.     testosterone cypionate 200 MG/ML injection  Commonly known as:  DEPOTESTOTERONE CYPIONATE  Inject 160 mg into the muscle See admin instructions. Pt does every 12 days        Activity: activity as tolerated Diet: low fat, low cholesterol diet Wound Care: none needed  Follow-up:  With Dr. Dalbert Batman in 3 weeks.  Signed: [name]. Dalbert Batman, M.D., FACS General and minimally invasive surgery Breast and Colorectal Surgery  10/27/2013, 5:38 AM

## 2013-10-27 NOTE — Discharge Instructions (Signed)
-  see above 

## 2013-10-28 ENCOUNTER — Encounter (HOSPITAL_COMMUNITY): Payer: Self-pay | Admitting: General Surgery

## 2013-11-10 ENCOUNTER — Ambulatory Visit (INDEPENDENT_AMBULATORY_CARE_PROVIDER_SITE_OTHER): Payer: Medicare Other | Admitting: General Surgery

## 2013-11-10 ENCOUNTER — Encounter (INDEPENDENT_AMBULATORY_CARE_PROVIDER_SITE_OTHER): Payer: Self-pay | Admitting: General Surgery

## 2013-11-10 VITALS — BP 126/80 | HR 78 | Temp 97.8°F | Resp 16 | Ht 70.0 in | Wt 175.0 lb

## 2013-11-10 DIAGNOSIS — K8020 Calculus of gallbladder without cholecystitis without obstruction: Secondary | ICD-10-CM

## 2013-11-10 NOTE — Progress Notes (Signed)
Patient ID: Edwin Yu, male   DOB: May 12, 1956, 58 y.o.   MRN: [id number] History: This gentleman underwent laparoscopic cholecystectomy and removal of Port-A-Cath 06/25/2014. Final pathology report showed chronic cholecystitis with cholelithiasis. He has no complaints about any of his wounds. No diarrhea. No nausea or vomiting. He states that he is much better and the right upper quadrant pain has resolved.  Exam: Patient looks well. No distress Chest clear to auscultation. Port removal incision right infraclavicular area looks very good. Abdomen soft and nontender. All trocar sites well healed  Assessment: Chronic cholecystitis with cholelithiasis, uneventful recovery following laparoscopic cholecystectomy History of lymphoma in remission. Status post Port-A-Cath rule without obvious surgical complications History HBV positive blood test  Plan: Low fat diet advised Resume normal activities, no restriction Return to see me as necessary.   [name]. Dalbert Batman, M.D., [facility] General and Minimally invasive Surgery Breast and Colorectal Surgery Office:   [phone] Pager:   [phone]

## 2013-11-10 NOTE — Patient Instructions (Signed)
You have recovered from your gallbladder surgery and Port-A-Cath removal without any obvious surgical complications.  You may resume normal physical activities without restriction  Low-fat diet is advised  Return to see Dr. Dalbert Batman as necessary

## 2014-02-22 ENCOUNTER — Other Ambulatory Visit: Payer: Self-pay

## 2014-02-22 ENCOUNTER — Telehealth: Payer: Self-pay | Admitting: Hematology and Oncology

## 2014-02-22 ENCOUNTER — Ambulatory Visit: Payer: Self-pay | Admitting: Hematology and Oncology

## 2014-02-22 NOTE — Telephone Encounter (Signed)
returned pt call and r/s pt appt...pt aware of new time

## 2014-03-09 ENCOUNTER — Telehealth: Payer: Self-pay | Admitting: Hematology and Oncology

## 2014-03-09 ENCOUNTER — Ambulatory Visit (HOSPITAL_BASED_OUTPATIENT_CLINIC_OR_DEPARTMENT_OTHER): Payer: Medicare Other | Admitting: Hematology and Oncology

## 2014-03-09 ENCOUNTER — Other Ambulatory Visit (HOSPITAL_BASED_OUTPATIENT_CLINIC_OR_DEPARTMENT_OTHER): Payer: Medicare Other

## 2014-03-09 ENCOUNTER — Ambulatory Visit (HOSPITAL_BASED_OUTPATIENT_CLINIC_OR_DEPARTMENT_OTHER): Payer: Medicare Other

## 2014-03-09 ENCOUNTER — Encounter: Payer: Self-pay | Admitting: Hematology and Oncology

## 2014-03-09 ENCOUNTER — Other Ambulatory Visit: Payer: Self-pay | Admitting: Hematology and Oncology

## 2014-03-09 VITALS — BP 131/89 | HR 86 | Temp 97.8°F | Resp 18 | Wt 175.5 lb

## 2014-03-09 VITALS — BP 143/87 | HR 86

## 2014-03-09 DIAGNOSIS — D751 Secondary polycythemia: Secondary | ICD-10-CM

## 2014-03-09 DIAGNOSIS — K458 Other specified abdominal hernia without obstruction or gangrene: Secondary | ICD-10-CM | POA: Insufficient documentation

## 2014-03-09 DIAGNOSIS — K4090 Unilateral inguinal hernia, without obstruction or gangrene, not specified as recurrent: Secondary | ICD-10-CM

## 2014-03-09 DIAGNOSIS — D239 Other benign neoplasm of skin, unspecified: Secondary | ICD-10-CM

## 2014-03-09 DIAGNOSIS — C8298 Follicular lymphoma, unspecified, lymph nodes of multiple sites: Secondary | ICD-10-CM

## 2014-03-09 DIAGNOSIS — C8218 Follicular lymphoma grade II, lymph nodes of multiple sites: Secondary | ICD-10-CM

## 2014-03-09 DIAGNOSIS — D229 Melanocytic nevi, unspecified: Secondary | ICD-10-CM

## 2014-03-09 DIAGNOSIS — F17210 Nicotine dependence, cigarettes, uncomplicated: Secondary | ICD-10-CM

## 2014-03-09 DIAGNOSIS — F17200 Nicotine dependence, unspecified, uncomplicated: Secondary | ICD-10-CM

## 2014-03-09 HISTORY — DX: Other specified abdominal hernia without obstruction or gangrene: K45.8

## 2014-03-09 HISTORY — DX: Melanocytic nevi, unspecified: D22.9

## 2014-03-09 HISTORY — DX: Secondary polycythemia: D75.1

## 2014-03-09 LAB — COMPREHENSIVE METABOLIC PANEL (CC13)
ALT: 23 U/L (ref 0–55)
AST: 16 U/L (ref 5–34)
Albumin: 4.4 g/dL (ref 3.5–5.0)
Alkaline Phosphatase: 51 U/L (ref 40–150)
Anion Gap: 10 mEq/L (ref 3–11)
BILIRUBIN TOTAL: 0.51 mg/dL (ref 0.20–1.20)
BUN: 6.9 mg/dL — ABNORMAL LOW (ref 7.0–26.0)
CO2: 32 mEq/L — ABNORMAL HIGH (ref 22–29)
CREATININE: 1 mg/dL (ref 0.7–1.3)
Calcium: 10 mg/dL (ref 8.4–10.4)
Chloride: 98 mEq/L (ref 98–109)
Glucose: 95 mg/dl (ref 70–140)
Potassium: 3.3 mEq/L — ABNORMAL LOW (ref 3.5–5.1)
Sodium: 140 mEq/L (ref 136–145)
Total Protein: 6.9 g/dL (ref 6.4–8.3)

## 2014-03-09 LAB — CBC WITH DIFFERENTIAL/PLATELET
BASO%: 0.9 % (ref 0.0–2.0)
Basophils Absolute: 0.1 10*3/uL (ref 0.0–0.1)
EOS%: 1.5 % (ref 0.0–7.0)
Eosinophils Absolute: 0.1 10*3/uL (ref 0.0–0.5)
HEMATOCRIT: 53.4 % — AB (ref 38.4–49.9)
HGB: 17.9 g/dL — ABNORMAL HIGH (ref 13.0–17.1)
LYMPH#: 2.3 10*3/uL (ref 0.9–3.3)
LYMPH%: 35.8 % (ref 14.0–49.0)
MCH: 28.7 pg (ref 27.2–33.4)
MCHC: 33.4 g/dL (ref 32.0–36.0)
MCV: 85.8 fL (ref 79.3–98.0)
MONO#: 0.6 10*3/uL (ref 0.1–0.9)
MONO%: 9.3 % (ref 0.0–14.0)
NEUT#: 3.4 10*3/uL (ref 1.5–6.5)
NEUT%: 52.5 % (ref 39.0–75.0)
PLATELETS: 249 10*3/uL (ref 140–400)
RBC: 6.23 10*6/uL — ABNORMAL HIGH (ref 4.20–5.82)
RDW: 13.2 % (ref 11.0–14.6)
WBC: 6.5 10*3/uL (ref 4.0–10.3)

## 2014-03-09 LAB — LACTATE DEHYDROGENASE (CC13): LDH: 125 U/L (ref 125–245)

## 2014-03-09 NOTE — Telephone Encounter (Signed)
gv adn rpinted appt sched and avs for pt for July 2015 and  2016.Marland KitchenMarland KitchenMarland KitchenPt sched to see Dr. Caryl Ada on 7.8 @ 2:20pm...pt sched to see Dr. Dalbert Batman  on 7.23 @ 11am

## 2014-03-09 NOTE — Progress Notes (Signed)
1 unit removed from patient. Snacks and drinks before and after phlebotomy. Patient tolerated well.

## 2014-03-09 NOTE — Assessment & Plan Note (Signed)
Likely a combination from testosterone injection and smoking. He is at risk of blood clot. He is taking aspirin. I recommend phlebotomy. The risk and benefit of phlebotomies discussed and he agreed to proceed. I recommend he review his testosterone replacement therapy with his primary care provider to either lower the dose or increase or frequency between injections to prevent another episode of erythrocytosis.

## 2014-03-09 NOTE — Assessment & Plan Note (Signed)
The moles are benign but given his history of lymphoma, we'll refer him to dermatologist for for evaluation and exclude malignant skin disease.

## 2014-03-09 NOTE — Progress Notes (Signed)
[place] OFFICE PROGRESS NOTE  Patient Care Team: Wannetta Sender. Rice, MD as PCP - General (Family Medicine) Izora Gala, MD as Attending Physician (Otolaryngology) Beryle Beams, MD as Attending Physician (Gastroenterology) Shanon Ace, MD as Referring Physician (Anesthesiology) Heath Lark, MD as Consulting Physician (Hematology and Oncology)  SUMMARY OF ONCOLOGIC HISTORY: This is a pleasant gentleman with background history of follicular lymphoma. It was discovered after palpable lymphadenopathy. Biopsy showed grade 2 with grade 3 features. The patient was placed on RCHOP chemotherapy for 6 cycles between June 2011 to October 2011. He was then placed on maintainence rituximab every other month from December 2011 to November 2013. His last imaging study in December 2014 showed no evidence of disease.  INTERVAL HISTORY: Please see below for problem oriented charting. He denies new lymphadenopathy. He is taking aspirin for erythrocytosis. Denies any bleeding. He complained of new moles on his skin. He also complained of left flank pain intermittently.  REVIEW OF SYSTEMS:   Constitutional: Denies fevers, chills or abnormal weight loss Eyes: Denies blurriness of vision Ears, nose, mouth, throat, and face: Denies mucositis or sore throat Respiratory: Denies cough, dyspnea or wheezes Cardiovascular: Denies palpitation, chest discomfort or lower extremity swelling Gastrointestinal:  Denies nausea, heartburn or change in bowel habits Skin: Denies abnormal skin rashes Lymphatics: Denies new lymphadenopathy or easy bruising Neurological:Denies numbness, tingling or new weaknesses Behavioral/Psych: Mood is stable, no new changes  All other systems were reviewed with the patient and are negative.  I have reviewed the past medical history, past surgical history, social history and family history with the patient and they are unchanged from previous note.  ALLERGIES:   has No Known Allergies.  MEDICATIONS:  Current Outpatient Prescriptions  Medication Sig Dispense Refill  . alendronate (FOSAMAX) 70 MG tablet Take 70 mg by mouth every Tuesday. Take with a full glass of water on an empty stomach.      [name] aspirin 81 MG tablet Take 81 mg by mouth daily.      [name] buPROPion (WELLBUTRIN XL) 150 MG 24 hr tablet Take 150 mg by mouth every morning.       . diphenhydramine-acetaminophen (TYLENOL PM) 25-500 MG TABS Take 1 tablet by mouth at bedtime as needed (sleep).       [name] escitalopram (LEXAPRO) 10 MG tablet Take 10 mg by mouth every morning.       [name] morphine (MS CONTIN) 100 MG 12 hr tablet Take 100 mg by mouth 3 (three) times daily.       [name] testosterone cypionate (DEPOTESTOTERONE CYPIONATE) 200 MG/ML injection Inject 160 mg into the muscle See admin instructions. Pt does every 12 days       No current facility-administered medications for this visit.    PHYSICAL EXAMINATION: ECOG PERFORMANCE STATUS: 0 - Asymptomatic  Filed Vitals:   03/09/14 1318  BP: 131/89  Pulse: 86  Temp: 97.8 F (36.6 C)  Resp: 18   Filed Weights   03/09/14 1318  Weight: 175 lb 8 oz (79.606 kg)    GENERAL:alert, no distress and comfortable SKIN: skin color, texture, turgor are normal, no rashes or significant lesions. Numerous skin moles, nothing suspicious for melanoma EYES: normal, Conjunctiva are pink and non-injected, sclera clear OROPHARYNX:no exudate, no erythema and lips, buccal mucosa, and tongue normal  NECK: supple, thyroid normal size, non-tender, without nodularity LYMPH:  no palpable lymphadenopathy in the cervical, axillary or inguinal LUNGS: clear to auscultation and percussion with normal breathing effort HEART: regular rate &  rhythm and no murmurs and no lower extremity edema ABDOMEN:abdomen soft, non-tender and normal bowel sounds. Noted small hernia in the upper left inguinal region. Musculoskeletal:no cyanosis of digits and no clubbing  NEURO: alert & oriented  x 3 with fluent speech, no focal motor/sensory deficits  LABORATORY DATA:  I have reviewed the data as listed    Component Value Date/Time   NA 140 03/09/2014 1250   NA 141 10/22/2013 0915   NA 142 01/22/2012 0839   K 3.3* 03/09/2014 1250   K 4.2 10/22/2013 0915   K 4.0 01/22/2012 0839   CL 98 10/22/2013 0915   CL 99 12/08/2012 1508   CL 98 01/22/2012 0839   CO2 32* 03/09/2014 1250   CO2 33* 10/22/2013 0915   CO2 31 01/22/2012 0839   GLUCOSE 95 03/09/2014 1250   GLUCOSE 99 10/22/2013 0915   GLUCOSE 93 12/08/2012 1508   GLUCOSE 89 01/22/2012 0839   BUN 6.9* 03/09/2014 1250   BUN 12 10/22/2013 0915   BUN 9 01/22/2012 0839   CREATININE 1.0 03/09/2014 1250   CREATININE 1.03 10/22/2013 0915   CREATININE 0.79 02/27/2012 1538   CALCIUM 10.0 03/09/2014 1250   CALCIUM 9.9 10/22/2013 0915   CALCIUM 9.0 01/22/2012 0839   PROT 6.9 03/09/2014 1250   PROT 6.7 10/22/2013 0915   PROT 6.5 01/22/2012 0839   ALBUMIN 4.4 03/09/2014 1250   ALBUMIN 4.2 10/22/2013 0915   AST 16 03/09/2014 1250   AST 17 10/22/2013 0915   AST 20 01/22/2012 0839   ALT 23 03/09/2014 1250   ALT 18 10/22/2013 0915   ALT 28 01/22/2012 0839   ALKPHOS 51 03/09/2014 1250   ALKPHOS 50 10/22/2013 0915   ALKPHOS 64 01/22/2012 0839   BILITOT 0.51 03/09/2014 1250   BILITOT 0.5 10/22/2013 0915   BILITOT 0.60 01/22/2012 0839   GFRNONAA 80* 10/22/2013 0915   GFRAA >90 10/22/2013 0915    No results found for this basename: SPEP,  UPEP,   kappa and lambda light chains    Lab Results  Component Value Date   WBC 6.5 03/09/2014   NEUTROABS 3.4 03/09/2014   HGB 17.9* 03/09/2014   HCT 53.4* 03/09/2014   MCV 85.8 03/09/2014   PLT 249 03/09/2014      Chemistry      Component Value Date/Time   NA 140 03/09/2014 1250   NA 141 10/22/2013 0915   NA 142 01/22/2012 0839   K 3.3* 03/09/2014 1250   K 4.2 10/22/2013 0915   K 4.0 01/22/2012 0839   CL 98 10/22/2013 0915   CL 99 12/08/2012 1508   CL 98 01/22/2012 0839   CO2 32* 03/09/2014 1250   CO2 33* 10/22/2013 0915   CO2 31 01/22/2012 0839   BUN  6.9* 03/09/2014 1250   BUN 12 10/22/2013 0915   BUN 9 01/22/2012 0839   CREATININE 1.0 03/09/2014 1250   CREATININE 1.03 10/22/2013 0915   CREATININE 0.79 02/27/2012 1538      Component Value Date/Time   CALCIUM 10.0 03/09/2014 1250   CALCIUM 9.9 10/22/2013 0915   CALCIUM 9.0 01/22/2012 0839   ALKPHOS 51 03/09/2014 1250   ALKPHOS 50 10/22/2013 0915   ALKPHOS 64 01/22/2012 0839   AST 16 03/09/2014 1250   AST 17 10/22/2013 0915   AST 20 01/22/2012 0839   ALT 23 03/09/2014 1250   ALT 18 10/22/2013 0915   ALT 28 01/22/2012 0839   BILITOT 0.51 03/09/2014 1250   BILITOT 0.5 10/22/2013  0915   BILITOT 0.60 01/22/2012 0839       ASSESSMENT & PLAN:  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 I will continue history, physical examination and blood work on a yearly basis  Numerous moles The moles are benign but given his history of lymphoma, we'll refer him to dermatologist for for evaluation and exclude malignant skin disease.  Hernia of flank He is symptomatic with a hernia in the left flank/upper inguinal region. I will refer him back to his surgeon for evaluation and possible hernia repair.  Erythrocytosis Likely a combination from testosterone injection and smoking. He is at risk of blood clot. He is taking aspirin. I recommend phlebotomy. The risk and benefit of phlebotomies discussed and he agreed to proceed. I recommend he review his testosterone replacement therapy with his primary care provider to either lower the dose or increase or frequency between injections to prevent another episode of erythrocytosis.  Cigarette smoker I spent some time counseling the patient the importance of tobacco cessation. he is currently attempting to quit on his own    Orders Placed This Encounter  Procedures  . CBC with Differential    Standing Status: Future     Number of Occurrences:      Standing Expiration Date: 03/09/2015  . Comprehensive  metabolic panel    Standing Status: Future     Number of Occurrences:      Standing Expiration Date: 03/09/2015  . Lactate dehydrogenase    Standing Status: Future     Number of Occurrences:      Standing Expiration Date: 03/09/2015  . Ambulatory referral to Dermatology    Referral Priority:  Routine    Referral Type:  Consultation    Referral Reason:  Specialty Services Required    Requested Specialty:  Dermatology    Number of Visits Requested:  1  . Ambulatory referral to General Surgery    Referral Priority:  Routine    Referral Type:  Surgical    Referral Reason:  Specialty Services Required    Referred to Provider:  Adin Hector, MD    Requested Specialty:  General Surgery    Number of Visits Requested:  1  . Perform theraputic phlebotomy    Standing Status: Standing     Number of Occurrences: 1     Standing Expiration Date:    All questions were answered. The patient knows to call the clinic with any problems, questions or concerns. No barriers to learning was detected. I spent 40 minutes counseling the patient face to face. The total time spent in the appointment was 55 minutes and more than 50% was on counseling and review of test results     [facility], [place], MD 03/09/2014 1:52 PM

## 2014-03-09 NOTE — Assessment & Plan Note (Signed)
I spent some time counseling the patient the importance of tobacco cessation. he is currently attempting to quit on his own 

## 2014-03-09 NOTE — Assessment & Plan Note (Signed)
Clinically, he has no evidence of recurrence. I will continue history, physical examination and blood work on a yearly basis

## 2014-03-09 NOTE — Assessment & Plan Note (Signed)
He is symptomatic with a hernia in the left flank/upper inguinal region. I will refer him back to his surgeon for evaluation and possible hernia repair.

## 2014-03-10 ENCOUNTER — Telehealth: Payer: Self-pay | Admitting: Hematology and Oncology

## 2014-03-10 NOTE — Telephone Encounter (Signed)
Faxed pt medical records to Dr. Melissa Montane office

## 2014-03-25 ENCOUNTER — Encounter (INDEPENDENT_AMBULATORY_CARE_PROVIDER_SITE_OTHER): Payer: Medicare Other | Admitting: General Surgery

## 2014-08-21 ENCOUNTER — Other Ambulatory Visit: Payer: Self-pay | Admitting: Nurse Practitioner

## 2015-03-10 ENCOUNTER — Other Ambulatory Visit: Payer: Self-pay

## 2015-03-10 ENCOUNTER — Ambulatory Visit: Payer: Self-pay | Admitting: Hematology and Oncology

## 2015-07-05 ENCOUNTER — Telehealth: Payer: Self-pay | Admitting: Hematology and Oncology

## 2015-07-05 NOTE — Telephone Encounter (Signed)
pt called to r/s missed appt...done...pt ok and aware °

## 2015-07-14 ENCOUNTER — Ambulatory Visit (HOSPITAL_BASED_OUTPATIENT_CLINIC_OR_DEPARTMENT_OTHER): Payer: PPO | Admitting: Hematology and Oncology

## 2015-07-14 ENCOUNTER — Other Ambulatory Visit: Payer: Self-pay | Admitting: Hematology and Oncology

## 2015-07-14 ENCOUNTER — Other Ambulatory Visit (HOSPITAL_BASED_OUTPATIENT_CLINIC_OR_DEPARTMENT_OTHER): Payer: PPO

## 2015-07-14 ENCOUNTER — Encounter: Payer: Self-pay | Admitting: Hematology and Oncology

## 2015-07-14 ENCOUNTER — Telehealth: Payer: Self-pay | Admitting: Hematology and Oncology

## 2015-07-14 VITALS — BP 137/99 | HR 106 | Temp 98.5°F | Resp 19 | Ht 70.0 in | Wt 184.8 lb

## 2015-07-14 DIAGNOSIS — D751 Secondary polycythemia: Secondary | ICD-10-CM | POA: Diagnosis not present

## 2015-07-14 DIAGNOSIS — Z87891 Personal history of nicotine dependence: Secondary | ICD-10-CM | POA: Diagnosis not present

## 2015-07-14 DIAGNOSIS — C8218 Follicular lymphoma grade II, lymph nodes of multiple sites: Secondary | ICD-10-CM

## 2015-07-14 DIAGNOSIS — D229 Melanocytic nevi, unspecified: Secondary | ICD-10-CM

## 2015-07-14 DIAGNOSIS — R109 Unspecified abdominal pain: Secondary | ICD-10-CM

## 2015-07-14 DIAGNOSIS — Z8572 Personal history of non-Hodgkin lymphomas: Secondary | ICD-10-CM | POA: Insufficient documentation

## 2015-07-14 HISTORY — DX: Personal history of non-Hodgkin lymphomas: Z85.72

## 2015-07-14 LAB — CBC WITH DIFFERENTIAL/PLATELET
BASO%: 0.8 % (ref 0.0–2.0)
BASOS ABS: 0 10*3/uL (ref 0.0–0.1)
EOS%: 2.4 % (ref 0.0–7.0)
Eosinophils Absolute: 0.1 10*3/uL (ref 0.0–0.5)
HCT: 45.7 % (ref 38.4–49.9)
HGB: 15.6 g/dL (ref 13.0–17.1)
LYMPH%: 39.1 % (ref 14.0–49.0)
MCH: 29.1 pg (ref 27.2–33.4)
MCHC: 34.1 g/dL (ref 32.0–36.0)
MCV: 85.1 fL (ref 79.3–98.0)
MONO#: 0.5 10*3/uL (ref 0.1–0.9)
MONO%: 9.3 % (ref 0.0–14.0)
NEUT#: 2.4 10*3/uL (ref 1.5–6.5)
NEUT%: 48.4 % (ref 39.0–75.0)
Platelets: 208 10*3/uL (ref 140–400)
RBC: 5.37 10*6/uL (ref 4.20–5.82)
RDW: 12.7 % (ref 11.0–14.6)
WBC: 4.9 10*3/uL (ref 4.0–10.3)
lymph#: 1.9 10*3/uL (ref 0.9–3.3)

## 2015-07-14 LAB — COMPREHENSIVE METABOLIC PANEL (CC13)
ALK PHOS: 42 U/L (ref 40–150)
ALT: 22 U/L (ref 0–55)
AST: 16 U/L (ref 5–34)
Albumin: 4.3 g/dL (ref 3.5–5.0)
Anion Gap: 8 mEq/L (ref 3–11)
BUN: 8 mg/dL (ref 7.0–26.0)
CO2: 30 meq/L — AB (ref 22–29)
Calcium: 9.6 mg/dL (ref 8.4–10.4)
Chloride: 102 mEq/L (ref 98–109)
Creatinine: 0.9 mg/dL (ref 0.7–1.3)
Glucose: 92 mg/dl (ref 70–140)
POTASSIUM: 3.7 meq/L (ref 3.5–5.1)
Sodium: 140 mEq/L (ref 136–145)
Total Bilirubin: 0.51 mg/dL (ref 0.20–1.20)
Total Protein: 6.5 g/dL (ref 6.4–8.3)

## 2015-07-14 LAB — LACTATE DEHYDROGENASE (CC13): LDH: 114 U/L — ABNORMAL LOW (ref 125–245)

## 2015-07-14 NOTE — Assessment & Plan Note (Signed)
He had recent GI bleed from gastric ulcer. He is receiving treatment for that. This is related to testosterone replacement therapy. He denies history of blood clots. I will recommend his primary care provider to monitor his blood count carefully

## 2015-07-14 NOTE — Progress Notes (Signed)
[facility] OFFICE PROGRESS NOTE  Patient Care Team: Garlan Fillers, MD as PCP - General (Family Medicine) Izora Gala, MD as Attending Physician (Otolaryngology) Carol Ada, MD as Attending Physician (Gastroenterology) Shanon Ace, MD as Referring Physician (Anesthesiology) Heath Lark, MD as Consulting Physician (Hematology and Oncology)  SUMMARY OF ONCOLOGIC HISTORY:  This is a pleasant gentleman with background history of follicular lymphoma. It was discovered after palpable lymphadenopathy. Biopsy showed grade 2 with grade 3 features. The patient was placed on RCHOP chemotherapy for 6 cycles between June 2011 to October 2011. He was then placed on maintainence rituximab every other month from December 2011 to November 2013. His last imaging study in December 2014 showed no evidence of disease. The patient has successfully quit smoking May 2016.   INTERVAL HISTORY: Please see below for problem oriented charting. He denies new lymphadenopathy. He was recently found to have gastric ulcer and is taking triple therapy for H. pylori infection He denies new moles on his skin. He also complained of left flank pain intermittently. He denies change in bowel habits.  REVIEW OF SYSTEMS:   Constitutional: Denies fevers, chills or abnormal weight loss Eyes: Denies blurriness of vision Ears, nose, mouth, throat, and face: Denies mucositis or sore throat Respiratory: Denies cough, dyspnea or wheezes Cardiovascular: Denies palpitation, chest discomfort or lower extremity swelling Gastrointestinal:  Denies nausea, heartburn or change in bowel habits Skin: Denies abnormal skin rashes Lymphatics: Denies new lymphadenopathy or easy bruising Neurological:Denies numbness, tingling or new weaknesses Behavioral/Psych: Mood is stable, no new changes  All other systems were reviewed with the patient and are negative.  I have reviewed the past medical history, past surgical  history, social history and family history with the patient and they are unchanged from previous note.  ALLERGIES:  has No Known Allergies.  MEDICATIONS:  Current Outpatient Prescriptions  Medication Sig Dispense Refill  . aspirin 81 MG tablet Take 81 mg by mouth daily.    . diphenhydramine-acetaminophen (TYLENOL PM) 25-500 MG TABS Take 1 tablet by mouth at bedtime as needed (sleep).     [name] LISINOPRIL PO Take by mouth.    . morphine (MS CONTIN) 100 MG 12 hr tablet Take 100 mg by mouth 3 (three) times daily.     [name] testosterone cypionate (DEPOTESTOTERONE CYPIONATE) 200 MG/ML injection Inject 160 mg into the muscle See admin instructions. Pt does every 12 days    . Zoledronic Acid (RECLAST IV) Inject into the vein.     No current facility-administered medications for this visit.    PHYSICAL EXAMINATION: ECOG PERFORMANCE STATUS: 0 - Asymptomatic  Filed Vitals:   07/14/15 0927  BP: 137/99  Pulse: 106  Temp: 98.5 F (36.9 C)  Resp: 19   Filed Weights   07/14/15 0927  Weight: 184 lb 12.8 oz (83.825 kg)    GENERAL:alert, no distress and comfortable SKIN: skin color, texture, turgor are normal, no rashes or significant lesions. He has numerous moles but nothing suspicious for cancer EYES: normal, Conjunctiva are pink and non-injected, sclera clear OROPHARYNX:no exudate, no erythema and lips, buccal mucosa, and tongue normal  NECK: supple, thyroid normal size, non-tender, without nodularity LYMPH:  no palpable lymphadenopathy in the cervical, axillary or inguinal LUNGS: clear to auscultation and percussion with normal breathing effort HEART: regular rate & rhythm and no murmurs and no lower extremity edema ABDOMEN:abdomen soft, non-tender and normal bowel sounds Musculoskeletal:no cyanosis of digits and no clubbing  NEURO: alert & oriented x 3 with fluent  speech, no focal motor/sensory deficits  LABORATORY DATA:  I have reviewed the data as listed    Component Value Date/Time    NA 140 03/09/2014 1250   NA 141 10/22/2013 0915   NA 142 01/22/2012 0839   K 3.3* 03/09/2014 1250   K 4.2 10/22/2013 0915   K 4.0 01/22/2012 0839   CL 98 10/22/2013 0915   CL 99 12/08/2012 1508   CL 98 01/22/2012 0839   CO2 32* 03/09/2014 1250   CO2 33* 10/22/2013 0915   CO2 31 01/22/2012 0839   GLUCOSE 95 03/09/2014 1250   GLUCOSE 99 10/22/2013 0915   GLUCOSE 93 12/08/2012 1508   GLUCOSE 89 01/22/2012 0839   BUN 6.9* 03/09/2014 1250   BUN 12 10/22/2013 0915   BUN 9 01/22/2012 0839   CREATININE 1.0 03/09/2014 1250   CREATININE 1.03 10/22/2013 0915   CREATININE 0.79 02/27/2012 1538   CALCIUM 10.0 03/09/2014 1250   CALCIUM 9.9 10/22/2013 0915   CALCIUM 9.0 01/22/2012 0839   PROT 6.9 03/09/2014 1250   PROT 6.7 10/22/2013 0915   PROT 6.5 01/22/2012 0839   ALBUMIN 4.4 03/09/2014 1250   ALBUMIN 4.2 10/22/2013 0915   ALBUMIN 3.5 01/22/2012 0839   AST 16 03/09/2014 1250   AST 17 10/22/2013 0915   AST 20 01/22/2012 0839   ALT 23 03/09/2014 1250   ALT 18 10/22/2013 0915   ALT 28 01/22/2012 0839   ALKPHOS 51 03/09/2014 1250   ALKPHOS 50 10/22/2013 0915   ALKPHOS 64 01/22/2012 0839   BILITOT 0.51 03/09/2014 1250   BILITOT 0.5 10/22/2013 0915   BILITOT 0.60 01/22/2012 0839   GFRNONAA 80* 10/22/2013 0915   GFRAA >90 10/22/2013 0915    No results found for: SPEP, UPEP  Lab Results  Component Value Date   WBC 4.9 07/14/2015   NEUTROABS 2.4 07/14/2015   HGB 15.6 07/14/2015   HCT 45.7 07/14/2015   MCV 85.1 07/14/2015   PLT 208 07/14/2015      Chemistry      Component Value Date/Time   NA 140 03/09/2014 1250   NA 141 10/22/2013 0915   NA 142 01/22/2012 0839   K 3.3* 03/09/2014 1250   K 4.2 10/22/2013 0915   K 4.0 01/22/2012 0839   CL 98 10/22/2013 0915   CL 99 12/08/2012 1508   CL 98 01/22/2012 0839   CO2 32* 03/09/2014 1250   CO2 33* 10/22/2013 0915   CO2 31 01/22/2012 0839   BUN 6.9* 03/09/2014 1250   BUN 12 10/22/2013 0915   BUN 9 01/22/2012 0839    CREATININE 1.0 03/09/2014 1250   CREATININE 1.03 10/22/2013 0915   CREATININE 0.79 02/27/2012 1538      Component Value Date/Time   CALCIUM 10.0 03/09/2014 1250   CALCIUM 9.9 10/22/2013 0915   CALCIUM 9.0 01/22/2012 0839   ALKPHOS 51 03/09/2014 1250   ALKPHOS 50 10/22/2013 0915   ALKPHOS 64 01/22/2012 0839   AST 16 03/09/2014 1250   AST 17 10/22/2013 0915   AST 20 01/22/2012 0839   ALT 23 03/09/2014 1250   ALT 18 10/22/2013 0915   ALT 28 01/22/2012 0839   BILITOT 0.51 03/09/2014 1250   BILITOT 0.5 10/22/2013 0915   BILITOT 0.60 01/22/2012 0839     ASSESSMENT & PLAN:  History of B-cell lymphoma Clinically, he has no evidence of recurrence. The patient is a long-term cancer survivor. I will transition his care to cancer survivorship clinic.  Numerous moles The moles  are benign I recommend dermatology follow-up.  Erythrocytosis He had recent GI bleed from gastric ulcer. He is receiving treatment for that. This is related to testosterone replacement therapy. He denies history of blood clots. I will recommend his primary care provider to monitor his blood count carefully   Orders Placed This Encounter  Procedures  . Comprehensive metabolic panel    Standing Status: Future     Number of Occurrences:      Standing Expiration Date: 08/17/2016  . CBC with Differential/Platelet    Standing Status: Future     Number of Occurrences:      Standing Expiration Date: 08/17/2016  . Lactate dehydrogenase    Standing Status: Future     Number of Occurrences:      Standing Expiration Date: 08/17/2016  . Amb Referral to Survivorship Long term    Referral Priority:  Routine    Referral Type:  Consultation    Number of Visits Requested:  1   All questions were answered. The patient knows to call the clinic with any problems, questions or concerns. No barriers to learning was detected. I spent 15 minutes counseling the patient face to face. The total time spent in the appointment  was 20 minutes and more than 50% was on counseling and review of test results     [facility], [place], MD 07/14/2015 9:45 AM

## 2015-07-14 NOTE — Telephone Encounter (Signed)
Message to Edwin Yu re lab/fu with her one year in [facility]. Patient aware he will be contacted by Elzie Rings. No other orders per 11/10 pof.

## 2015-07-14 NOTE — Assessment & Plan Note (Signed)
The moles are benign I recommend dermatology follow-up.

## 2015-07-14 NOTE — Assessment & Plan Note (Signed)
Clinically, he has no evidence of recurrence. The patient is a long-term cancer survivor. I will transition his care to cancer survivorship clinic.

## 2015-07-18 ENCOUNTER — Telehealth: Payer: Self-pay | Admitting: Adult Health

## 2015-07-18 NOTE — Telephone Encounter (Signed)
I called Mr. Hope to introduce myself and my role in his care as a cancer survivor.  I also called to let him know that we have made his appointments for lab and visit with me in 07/2016 and he stated that it would be great to just mail those appointments to his home, and if he needs to make changes to the appointments, he is welcome to call me.  I have also mailed a letter to him, along with a copy of my business card, encouraging him to contact me with any questions or concerns before next year. I would be happy to see him sooner, if needed. I look forward to participating in his care.    Mike Craze, NP [place] [phone]

## 2016-07-12 NOTE — Progress Notes (Deleted)
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   BRIEF ONCOLOGIC HISTORY:  (From Dr. Calton Dach last note on 07/14/15)   INTERVAL HISTORY:  Edwin Yu presents to the [facility] today for routine follow-up for his history of follicular lymphoma.  Overall, he reports feeling quite well. ***    REVIEW OF SYSTEMS:     GU: Denies vaginal bleeding, discharge, or dryness.  Breast: Denies any new nodularity, masses, tenderness, nipple changes, or nipple discharge.    A 14-point review of systems was completed and was negative, except as noted above.    PAST MEDICAL/SURGICAL HISTORY:  Past Medical History:  Diagnosis Date  . Abdominal pain, unspecified site 08/07/2013  . Cancer (HCC)    nhl  . Chronic lower back pain   . Erythrocytosis 03/09/2014  . 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  [name] Hepatitis B antibody positive 2011   on Lamivudine until finish of maintenance Rituxan in 08/2012.  [name] Hernia of flank 03/09/2014  . 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 History of chemotherapy about 1 1/2 yrs ago last tx  . Need for prophylactic vaccination and inoculation against influenza 08/07/2013  . Numerous moles 03/09/2014  . Osteoporosis    Past Surgical History:  Procedure Laterality Date  . CHOLECYSTECTOMY  10/26/2013   Procedure: LAPAROSCOPIC CHOLECYSTECTOMY;  Surgeon: Adin Hector, MD;  Location: [facility];  Service: General;;  . DENTAL SURGERY     had all teeth removed  . LYMPH NODE BIOPSY    . PORT-A-CATH REMOVAL Right 10/26/2013   Procedure: REMOVAL PORT-A-CATH;  Surgeon: Adin Hector, MD;  Location: [facility];  Service: General;  Laterality: Right;  . PORTACATH PLACEMENT    . WRIST SURGERY     broke right     ALLERGIES:  No Known  Allergies   CURRENT MEDICATIONS:  Outpatient Encounter Prescriptions as of 07/13/2016  Medication Sig  . aspirin 81 MG tablet Take 81 mg by mouth daily.  . diphenhydramine-acetaminophen (TYLENOL PM) 25-500 MG TABS Take 1 tablet by mouth at bedtime as needed (sleep).   [name] LISINOPRIL PO Take by mouth.  . morphine (MS CONTIN) 100 MG 12 hr tablet Take 100 mg by mouth 3 (three) times daily.   [name] testosterone cypionate (DEPOTESTOTERONE CYPIONATE) 200 MG/ML injection Inject 160 mg into the muscle See admin instructions. Pt does every 12 days  . Zoledronic Acid (RECLAST IV) Inject into the vein.   No facility-administered encounter medications on file as of 07/13/2016.      ONCOLOGIC FAMILY HISTORY:  Family History  Problem Relation Age of Onset  . Cancer Father     colon cancer  . Cancer Sister     lung ca      SOCIAL HISTORY:  BOWMAN BLAZINA is /single/married/divorced/widowed/separated and lives alone/with her spouse/family/friend in (city), [place].  Mr. Feist  has (#) children and they live in (city).  Currently retired/disabled/working part-time/full-time as ***.  Denies any current or history of tobacco, alcohol, or illicit drug use.***    PHYSICAL EXAMINATION:  Vital Signs: There were no vitals filed for this visit. There were no vitals filed for this visit. General: Well-nourished, well-appearing male/male*** in no acute distress. Accompanied/Unaccompanied today. HEENT: Head is normocephalic.  Pupils equal and reactive  to light. Conjunctivae clear without exudate.  Sclerae anicteric. Oral mucosa is pink, moist.  Oropharynx is pink without lesions or erythema.  Lymph: No cervical, supraclavicular, infraclavicular, or axillary lymphadenopathy noted on palpation.  Cardiovascular: Regular rate and rhythm.[name] Respiratory: Clear to auscultation bilaterally. Chest expansion symmetric; breathing non-labored.  GI: Abdomen soft and round; non-tender, non-distended. Bowel  sounds normoactive. No hepatosplenomegaly.   GU: Deferred.  Neuro: No focal deficits. Steady gait.  Psych: Mood and affect normal and appropriate for situation.  Extremities: No edema. Skin: Warm and dry.   LABORATORY DATA:  None for this visit***  DIAGNOSTIC IMAGING:  None for this visit***     ASSESSMENT AND PLAN:  Mr.. Yu is a pleasant 60 y.o. male/male*** with history of Stage ***  , diagnosed in ***; treated with ***. Edwin Yu presents to the [facility]orship Clinic for surveillance and routine follow-up.   1. History of Stage *** cancer:  Edwin Yu is currently clinically and radiographically without evidence of disease or recurrence of *** cancer. She will follow-up in the Survivorship Clinic in 1 year with labs, history, and physical exam per surveillance protocol.  I encouraged him to call me with any questions or concerns before his next visit at the cancer center, and I would be happy to see the patient sooner, if needed.    #. Problem(s) at Visit___________________.  #. Cancer screening:  Due to Edwin Yu history and age, he should receive screening for skin cancers, breast cancer, colon cancer, and ***gynecologic cancers. The patient was encouraged to follow-up with his PCP for appropriate cancer screenings.   #. Health maintenance and wellness promotion: Edwin Yu was encouraged to consume 5-7 servings of fruits and vegetables per day. The patient was also encouraged to engage in moderate to vigorous exercise for 30 minutes per day most days of the week. Edwin Yu was instructed to limit her alcohol consumption and continue to abstain from tobacco use/was encouraged stop smoking.  ***    Dispo:  -Return to cancer center to see Survivorship NP in ***   A total of (#) minutes of face-to-face time was spent with this patient with greater than 50% of that time in counseling and care-coordination.   Mike Craze, NP Survivorship Program [place] [phone]   Note: PRIMARY CARE PROVIDER Garlan Fillers, [place] [phone]

## 2016-07-13 ENCOUNTER — Other Ambulatory Visit: Payer: PPO

## 2016-07-13 ENCOUNTER — Ambulatory Visit: Payer: PPO | Admitting: Adult Health

## 2017-04-22 ENCOUNTER — Telehealth: Payer: Self-pay | Admitting: Hematology and Oncology

## 2017-04-22 NOTE — Telephone Encounter (Signed)
Faxed records to [facility]
